# Patient Record
Sex: Male | Born: 1960 | Race: White | Hispanic: No | Marital: Single | Smoking: Never smoker
Health system: Southern US, Community
[De-identification: ages and names within clinical notes are randomized; demographics above are authoritative.]

## PROBLEM LIST (undated history)

## (undated) DIAGNOSIS — F419 Anxiety disorder, unspecified: Secondary | ICD-10-CM

## (undated) DIAGNOSIS — M25559 Pain in unspecified hip: Secondary | ICD-10-CM

## (undated) DIAGNOSIS — R945 Abnormal results of liver function studies: Secondary | ICD-10-CM

## (undated) DIAGNOSIS — R569 Unspecified convulsions: Secondary | ICD-10-CM

## (undated) DIAGNOSIS — T7840XA Allergy, unspecified, initial encounter: Secondary | ICD-10-CM

## (undated) DIAGNOSIS — F1910 Other psychoactive substance abuse, uncomplicated: Secondary | ICD-10-CM

## (undated) DIAGNOSIS — G8929 Other chronic pain: Secondary | ICD-10-CM

## (undated) HISTORY — DX: Abnormal results of liver function studies: R94.5

## (undated) HISTORY — PX: NO PAST SURGERIES: SHX2092

## (undated) HISTORY — DX: Anxiety disorder, unspecified: F41.9

## (undated) HISTORY — DX: Other psychoactive substance abuse, uncomplicated: F19.10

## (undated) HISTORY — DX: Unspecified convulsions: R56.9

## (undated) HISTORY — DX: Other chronic pain: G89.29

## (undated) HISTORY — DX: Pain in unspecified hip: M25.559

## (undated) HISTORY — DX: Allergy, unspecified, initial encounter: T78.40XA

---

## 2015-08-01 DIAGNOSIS — M25559 Pain in unspecified hip: Secondary | ICD-10-CM

## 2015-08-01 DIAGNOSIS — G8929 Other chronic pain: Secondary | ICD-10-CM

## 2015-08-01 HISTORY — DX: Other chronic pain: G89.29

## 2015-10-13 ENCOUNTER — Other Ambulatory Visit: Payer: Self-pay | Admitting: Family Medicine

## 2015-10-13 DIAGNOSIS — M25552 Pain in left hip: Secondary | ICD-10-CM

## 2015-10-14 ENCOUNTER — Ambulatory Visit
Admission: RE | Admit: 2015-10-14 | Discharge: 2015-10-14 | Disposition: A | Discharge status: Home / Self Care | Payer: Managed Care, Other (non HMO) | Source: Ambulatory Visit | Attending: Family Medicine | Admitting: Family Medicine

## 2015-10-14 DIAGNOSIS — M87852 Other osteonecrosis, left femur: Secondary | ICD-10-CM | POA: Insufficient documentation

## 2015-10-14 DIAGNOSIS — M25552 Pain in left hip: Secondary | ICD-10-CM | POA: Insufficient documentation

## 2015-10-14 DIAGNOSIS — S73192A Other sprain of left hip, initial encounter: Secondary | ICD-10-CM | POA: Diagnosis not present

## 2015-11-15 ENCOUNTER — Emergency Department
Admission: EM | Admit: 2015-11-15 | Discharge: 2015-11-15 | Disposition: A | Discharge status: Home / Self Care | Payer: Managed Care, Other (non HMO) | Attending: Emergency Medicine | Admitting: Emergency Medicine

## 2015-11-15 ENCOUNTER — Encounter: Payer: Self-pay | Admitting: Emergency Medicine

## 2015-11-15 ENCOUNTER — Emergency Department: Payer: Managed Care, Other (non HMO)

## 2015-11-15 DIAGNOSIS — K2100 Gastro-esophageal reflux disease with esophagitis, without bleeding: Secondary | ICD-10-CM

## 2015-11-15 DIAGNOSIS — R1110 Vomiting, unspecified: Secondary | ICD-10-CM | POA: Diagnosis present

## 2015-11-15 DIAGNOSIS — R51 Headache: Secondary | ICD-10-CM | POA: Diagnosis not present

## 2015-11-15 LAB — COMPREHENSIVE METABOLIC PANEL
ALT: 90 U/L — ABNORMAL HIGH (ref 17–63)
ANION GAP: 9 (ref 5–15)
AST: 271 U/L — AB (ref 15–41)
Albumin: 3.9 g/dL (ref 3.5–5.0)
Alkaline Phosphatase: 96 U/L (ref 38–126)
BUN: 13 mg/dL (ref 6–20)
CHLORIDE: 101 mmol/L (ref 101–111)
CO2: 25 mmol/L (ref 22–32)
Calcium: 9.3 mg/dL (ref 8.9–10.3)
Creatinine, Ser: 0.73 mg/dL (ref 0.61–1.24)
Glucose, Bld: 114 mg/dL — ABNORMAL HIGH (ref 65–99)
POTASSIUM: 3.7 mmol/L (ref 3.5–5.1)
Sodium: 135 mmol/L (ref 135–145)
TOTAL PROTEIN: 7.3 g/dL (ref 6.5–8.1)
Total Bilirubin: 0.8 mg/dL (ref 0.3–1.2)

## 2015-11-15 LAB — LIPASE, BLOOD: LIPASE: 45 U/L (ref 11–51)

## 2015-11-15 LAB — CBC WITH DIFFERENTIAL/PLATELET
BASOS ABS: 0.1 10*3/uL (ref 0–0.1)
Basophils Relative: 1 %
EOS PCT: 2 %
Eosinophils Absolute: 0.2 10*3/uL (ref 0–0.7)
HCT: 47.9 % (ref 40.0–52.0)
Hemoglobin: 16.5 g/dL (ref 13.0–18.0)
LYMPHS PCT: 43 %
Lymphs Abs: 4.2 10*3/uL — ABNORMAL HIGH (ref 1.0–3.6)
MCH: 33.3 pg (ref 26.0–34.0)
MCHC: 34.5 g/dL (ref 32.0–36.0)
MCV: 96.5 fL (ref 80.0–100.0)
MONO ABS: 0.7 10*3/uL (ref 0.2–1.0)
MONOS PCT: 8 %
Neutro Abs: 4.5 10*3/uL (ref 1.4–6.5)
Neutrophils Relative %: 46 %
PLATELETS: 258 10*3/uL (ref 150–440)
RBC: 4.96 MIL/uL (ref 4.40–5.90)
RDW: 14.5 % (ref 11.5–14.5)
WBC: 9.8 10*3/uL (ref 3.8–10.6)

## 2015-11-15 LAB — ETHANOL: ALCOHOL ETHYL (B): 94 mg/dL — AB (ref <5)

## 2015-11-15 MED ORDER — LORAZEPAM 1 MG PO TABS: 1.0000 mg | ORAL_TABLET | Freq: Two times a day (BID) | ORAL | Status: DC | PRN

## 2015-11-15 MED ORDER — ONDANSETRON HCL 4 MG/2ML IJ SOLN
4.0000 mg | Freq: Once | INTRAMUSCULAR | Status: AC
  Administered 2015-11-15: 4 mg via INTRAVENOUS
  Filled 2015-11-15: qty 2

## 2015-11-15 MED ORDER — GI COCKTAIL ~~LOC~~
30.0000 mL | Freq: Once | ORAL | Status: AC
  Administered 2015-11-15: 30 mL via ORAL
  Filled 2015-11-15: qty 30

## 2015-11-15 MED ORDER — SODIUM CHLORIDE 0.9 % IV BOLUS (SEPSIS)
500.0000 mL | Freq: Once | INTRAVENOUS | Status: AC
  Administered 2015-11-15: 500 mL via INTRAVENOUS

## 2015-11-15 MED ORDER — ONDANSETRON HCL 4 MG PO TABS: 4.0000 mg | ORAL_TABLET | Freq: Three times a day (TID) | ORAL | Status: DC | PRN

## 2015-11-15 MED ORDER — LORAZEPAM 2 MG/ML IJ SOLN
1.0000 mg | Freq: Once | INTRAMUSCULAR | Status: AC
  Administered 2015-11-15: 1 mg via INTRAVENOUS
  Filled 2015-11-15: qty 1

## 2015-11-15 MED ORDER — PANTOPRAZOLE SODIUM 20 MG PO TBEC: 20.0000 mg | DELAYED_RELEASE_TABLET | Freq: Every day | ORAL | Status: DC

## 2015-11-15 NOTE — Discharge Instructions (Signed)
Esophagitis °Esophagitis is inflammation of the esophagus. The esophagus is the tube that carries food and liquids from your mouth to your stomach. Esophagitis can cause soreness or pain in the esophagus. This condition can make it difficult and painful to swallow.  °CAUSES °Most causes of esophagitis are not serious. Common causes of this condition include: °· Gastroesophageal reflux disease (GERD). This is when stomach contents move back up into the esophagus (reflux). °· Repeated vomiting. °· An allergic-type reaction, especially caused by food allergies (eosinophilic esophagitis). °· Injury to the esophagus by swallowing large pills with or without water, or swallowing certain types of medicines. °· Swallowing (ingesting) harmful chemicals, such as household cleaning products. °· Heavy alcohol use. °· An infection of the esophagus. This most often occurs in people who have a weakened immune system. °· Radiation or chemotherapy treatment for cancer. °· Certain diseases such as sarcoidosis, Crohn disease, and scleroderma. °SYMPTOMS °Symptoms of this condition include: °· Difficult or painful swallowing. °· Pain with swallowing acidic liquids, such as citrus juices. °· Pain with burping. °· Chest pain. °· Difficulty breathing. °· Nausea. °· Vomiting. °· Pain in the abdomen. °· Weight loss. °· Ulcers in the mouth. °· Patches of white material in the mouth (candidiasis). °· Fever. °· Coughing up blood or vomiting blood. °· Stool that is black, tarry, or bright red. °DIAGNOSIS °Your health care provider will take a medical history and perform a physical exam. You may also have other tests, including: °· An endoscopy to examine your stomach and esophagus with a small camera. °· A test that measures the acidity level in your esophagus. °· A test that measures how much pressure is on your esophagus. °· A barium swallow or modified barium swallow to show the shape, size, and functioning of your esophagus. °· Allergy  tests. °TREATMENT °Treatment for this condition depends on the cause of your esophagitis. In some cases, steroids or other medicines may be given to help relieve your symptoms or to treat the underlying cause of your condition. You may have to make some lifestyle changes, such as: °· Avoiding alcohol. °· Quitting smoking. °· Changing your diet. °· Exercising. °· Changing your sleep habits and your sleep environment. °HOME CARE INSTRUCTIONS °Take these actions to decrease your discomfort and to help avoid complications. °Diet °· Follow a diet as recommended by your health care provider. This may involve avoiding foods and drinks such as: °¨ Coffee and tea (with or without caffeine). °¨ Drinks that contain alcohol. °¨ Energy drinks and sports drinks. °¨ Carbonated drinks or sodas. °¨ Chocolate and cocoa. °¨ Peppermint and mint flavorings. °¨ Garlic and onions. °¨ Horseradish. °¨ Spicy and acidic foods, including peppers, chili powder, curry powder, vinegar, hot sauces, and barbecue sauce. °¨ Citrus fruit juices and citrus fruits, such as oranges, lemons, and limes. °¨ Tomato-based foods, such as red sauce, chili, salsa, and pizza with red sauce. °¨ Fried and fatty foods, such as donuts, french fries, potato chips, and high-fat dressings. °¨ High-fat meats, such as hot dogs and fatty cuts of red and white meats, such as rib eye steak, sausage, ham, and bacon. °¨ High-fat dairy items, such as whole milk, butter, and cream cheese. °· Eat small, frequent meals instead of large meals. °· Avoid drinking large amounts of liquid with your meals. °· Avoid eating meals during the 2-3 hours before bedtime. °· Avoid lying down right after you eat. °· Do not exercise right after you eat. °· Avoid foods and drinks that seem to   make your symptoms worse. General Instructions  Pay attention to any changes in your symptoms.  Take over-the-counter and prescription medicines only as told by your health care provider. Do not take  aspirin, ibuprofen, or other NSAIDs unless your health care provider told you to do so.  If you have trouble taking pills, use a pill splitter to decrease the size of the pill. This will decrease the chance of the pill getting stuck or injuring your esophagus on the way down. Also, drink water after you take a pill.  Do not use any tobacco products, including cigarettes, chewing tobacco, and e-cigarettes. If you need help quitting, ask your health care provider.  Wear loose-fitting clothing. Do not wear anything tight around your waist that causes pressure on your abdomen.  Raise (elevate) the head of your bed about 6 inches (15 cm).  Try to reduce your stress, such as with yoga or meditation. If you need help reducing stress, ask your health care provider.  If you are overweight, reduce your weight to an amount that is healthy for you. Ask your health care provider for guidance about a safe weight loss goal.  Keep all follow-up visits as told by your health care provider. This is important. SEEK MEDICAL CARE IF:  You have new symptoms.  You have unexplained weight loss.  You have difficulty swallowing, or it hurts to swallow.  You have wheezing or a persistent cough.  Your symptoms do not improve with treatment.  You have frequent heartburn for more than two weeks. SEEK IMMEDIATE MEDICAL CARE IF:  You have severe pain in your arms, neck, jaw, teeth, or back.  You feel sweaty, dizzy, or light-headed.  You have chest pain or shortness of breath.  You vomit and your vomit looks like blood or coffee grounds.  Your stool is bloody or black.  You have a fever.  You cannot swallow, drink, or eat.   This information is not intended to replace advice given to you by your health care provider. Make sure you discuss any questions you have with your health care provider.   Document Released: 08/24/2004 Document Revised: 04/07/2015 Document Reviewed: 11/11/2014 [employer]  Patient Education [employer]2016 [employer]  Please return immediately if condition worsens. Please contact her primary physician or the physician you were given for referral. If you have any specialist physicians involved in her treatment and plan please also contact them. Thank you for using [facility]. He most likely will require gastroenterology consultation. Please work on your alcohol addiction.

## 2015-11-15 NOTE — ED Notes (Addendum)
Pt presents to ED with headache and frequent vomiting for the past 4 hours with chest pain and left arm numbness for "a while but worse the past 2 weeks". Pt states since he got his dentures about 5 years ago he has been having a lot of nausea and vomiting after eating unless he drinks a lot of water. Pt reports tonight he had pork and started vomiting right after eating it. Tried Rolaids with no relief. Drinks beer daily (8-12). denies difficulty breathing. Pt appears slightly anxious. No increased work of breathing or acute distress noted. Skin warm and dry.

## 2015-11-15 NOTE — ED Provider Notes (Signed)
Time Seen: Approximately 2140 I have reviewed the triage notes  Chief Complaint: Chest Pain; Emesis; Numbness; and Headache   History of Present Illness: Chad Parrish is a 55 y.o. male who states he's had probably a several year history of intermittent chest discomfort with difficulty swallowing. Patient has a long history of heavy alcohol consumption. He states over the last couple weeks he's noticed an increase in the frequency and intensity of his symptoms worries having difficulty swallowing and has had some frequent vomiting and feels like the food is getting stuck. Patient stridor or over-the-counter Rolaids without relief. He states he's had approximately 5 beers today starting at 7 AM. Last alcohol consumption was probably around 4:30 this afternoon. He states that sometime when he felt like he had a piece of pork that got stuck from a [place] dinner. He is able get his own secretions down and denies any persistent vomiting other than when he is trying to eat. He denies any melena or hematochezia. Denies any focal abdominal pain. He states he chronically has loose stool.   History reviewed. No pertinent past medical history.  There are no active problems to display for this patient.   History reviewed. No pertinent past surgical history.  History reviewed. No pertinent past surgical history.  No current outpatient prescriptions on file.  Allergies:  Opium  Family History: No family history on file.  Social History: Social History  Substance Use Topics  . Smoking status: Never Smoker   . Smokeless tobacco: None  . Alcohol Use: 6.0 oz/week    10 Cans of beer per week     Review of Systems:   10 point review of systems was performed and was otherwise negative:  Constitutional: No fever Eyes: No visual disturbances ENT: No sore throat, ear pain Cardiac: Chest pain is primarily in the epigastric area to the lower chest region Respiratory: No shortness of breath,  wheezing, or stridor Abdomen: No lower abdominal pain ,  Endocrine: No weight loss, No night sweats Extremities: No peripheral edema, cyanosis Skin: No rashes, easy bruising Neurologic: No focal weakness, trouble with speech or swollowing Urologic: No dysuria, Hematuria, or urinary frequency   Physical Exam:  ED Triage Vitals  Enc Vitals Group     BP 11/15/15 2106 143/97 mmHg     Pulse Rate 11/15/15 2106 98     Resp 11/15/15 2058 20     Temp 11/15/15 2106 98.2 F (36.8 C)     Temp Source 11/15/15 2058 Oral     SpO2 11/15/15 2106 98 %     Weight 11/15/15 2058 170 lb (77.111 kg)     Height 11/15/15 2058 5\' 6"  (1.676 m)     Head Cir --      Peak Flow --      Pain Score 11/15/15 2058 5     Pain Loc --      Pain Edu? --      Excl. in GC? --     General: Awake , Alert , and Oriented times 3; GCS 15 Anxious Head: Normal cephalic , atraumatic Eyes: Pupils equal , round, reactive to light Nose/Throat: No nasal drainage, patent upper airway without erythema or exudate.  Neck: Supple, Full range of motion, No anterior adenopathy or palpable thyroid masses Lungs: Clear to ascultation without wheezes , rhonchi, or rales Heart: Regular rate, regular rhythm without murmurs , gallops , or rubs Abdomen: Pain is primarily in the epigastric area Soft, non tender without rebound, guarding ,  or rigidity; bowel sounds positive and symmetric in all 4 quadrants. No organomegaly .   Negative Murphy's sign and no palpable masses or lower peritoneal signs Extremities: 2 plus symmetric pulses. No edema, clubbing or cyanosis Neurologic: normal ambulation, Motor symmetric without deficits, sensory intact Skin: warm, dry, no rashes   Labs:   All laboratory work was reviewed including any pertinent negatives or positives listed below:  Labs Reviewed  CBC WITH DIFFERENTIAL/PLATELET  COMPREHENSIVE METABOLIC PANEL  LIPASE, BLOOD  ETHANOL    EKG: ED ECG REPORT I, Jennye Moccasin, the attending  physician, personally viewed and interpreted this ECG.  Date: 11/15/2015 EKG Time: 2053 Rate: 96 Rhythm: normal sinus rhythm QRS Axis: normal Intervals: normal ST/T Wave abnormalities: normal Conduction Disturbances: none Narrative Interpretation: unremarkable No acute ischemic changes   Radiology:   Final result by Rad Results In Interface (11/15/15 22:45:34)   Narrative:   CLINICAL DATA: Chest pain today. Left arm numbness. Vomiting.  EXAM: CHEST 2 VIEW  COMPARISON: None.  FINDINGS: The cardiomediastinal contours are normal. The lungs are clear. Pulmonary vasculature is normal. No consolidation, pleural effusion, or pneumothorax. No acute osseous abnormalities are seen.  IMPRESSION: No acute pulmonary process.   Electronically Signed By: Rubye Oaks M.D. On: 11/15/2015 22:45          I personally reviewed the radiologic studies    ED Course: Patient's stay here was uneventful and patient was symptomatically improved. I felt his chest pain was unlikely to be from a life-threatening source at this point though he has a long deep history of alcohol abuse. He most likely has some reflux with esophageal spasm, etc. Patient be started on proton pump inhibitor therapy and was given some IV fluids and Ativan here along with Zofran with symptomatic improvement. Does not appear to have the upper or lower gastrointestinal bleed. The patient will be discharged with appropriate medication and was advised to follow up with her primary physician and also seek  patient treatment for alcohol abuse. His results of his studies an outpatient recommendations were given at home with his partner listening and responding to questions etc.   Assessment: Alcohol abuse Gastroesophageal disease      Plan:  Outpatient management Patient was advised to return immediately if condition worsens. Patient was advised to follow up with their primary care physician or other  specialized physicians involved in their outpatient care. The patient and/or family member/power of attorney had laboratory results reviewed at the bedside. All questions and concerns were addressed and appropriate discharge instructions were distributed by the nursing staff.             Jennye Moccasin, MD 11/15/15 [phone]

## 2015-12-31 DIAGNOSIS — F1020 Alcohol dependence, uncomplicated: Secondary | ICD-10-CM | POA: Insufficient documentation

## 2015-12-31 DIAGNOSIS — R748 Abnormal levels of other serum enzymes: Secondary | ICD-10-CM | POA: Insufficient documentation

## 2015-12-31 DIAGNOSIS — K2920 Alcoholic gastritis without bleeding: Secondary | ICD-10-CM | POA: Insufficient documentation

## 2015-12-31 DIAGNOSIS — M87052 Idiopathic aseptic necrosis of left femur: Secondary | ICD-10-CM | POA: Insufficient documentation

## 2017-01-28 DIAGNOSIS — R945 Abnormal results of liver function studies: Secondary | ICD-10-CM

## 2017-01-28 HISTORY — DX: Abnormal results of liver function studies: R94.5

## 2017-10-03 ENCOUNTER — Other Ambulatory Visit: Payer: Self-pay

## 2017-10-03 ENCOUNTER — Encounter: Payer: Self-pay | Admitting: Psychiatry

## 2017-10-03 ENCOUNTER — Ambulatory Visit (INDEPENDENT_AMBULATORY_CARE_PROVIDER_SITE_OTHER): Payer: 59 | Admitting: Psychiatry

## 2017-10-03 VITALS — BP 162/90 | HR 73 | Temp 98.1°F

## 2017-10-03 DIAGNOSIS — F1924 Other psychoactive substance dependence with psychoactive substance-induced mood disorder: Secondary | ICD-10-CM

## 2017-10-03 DIAGNOSIS — F1210 Cannabis abuse, uncomplicated: Secondary | ICD-10-CM

## 2017-10-03 DIAGNOSIS — F1020 Alcohol dependence, uncomplicated: Secondary | ICD-10-CM

## 2017-10-03 DIAGNOSIS — F19239 Other psychoactive substance dependence with withdrawal, unspecified: Secondary | ICD-10-CM

## 2017-10-03 MED ORDER — MIRTAZAPINE 15 MG PO TABS: 7.5000 mg | ORAL_TABLET | Freq: Every day | ORAL | 1 refills | Status: DC

## 2017-10-03 MED ORDER — GABAPENTIN 100 MG PO CAPS
100.0000 mg | ORAL_CAPSULE | Freq: Two times a day (BID) | ORAL | 1 refills | Status: DC
Start: 2017-10-03 — End: 2018-09-14

## 2017-10-03 NOTE — Patient Instructions (Addendum)
Mirtazapine tablets What is this medicine? MIRTAZAPINE (mir TAZ a peen) is used to treat depression. This medicine may be used for other purposes; ask your health care provider or pharmacist if you have questions. COMMON BRAND NAME(S): Remeron What should I tell my health care provider before I take this medicine? They need to know if you have any of these conditions: -bipolar disorder -glaucoma -kidney disease -liver disease -suicidal thoughts -an unusual or allergic reaction to mirtazapine, other medicines, foods, dyes, or preservatives -pregnant or trying to get pregnant -breast-feeding How should I use this medicine? Take this medicine by mouth with a glass of water. Follow the directions on the prescription label. Take your medicine at regular intervals. Do not take your medicine more often than directed. Do not stop taking this medicine suddenly except upon the advice of your doctor. Stopping this medicine too quickly may cause serious side effects or your condition may worsen. A special MedGuide will be given to you by the pharmacist with each prescription and refill. Be sure to read this information carefully each time. Talk to your pediatrician regarding the use of this medicine in children. Special care may be needed. Overdosage: If you think you have taken too much of this medicine contact a poison control center or emergency room at once. NOTE: This medicine is only for you. Do not share this medicine with others. What if I miss a dose? If you miss a dose, take it as soon as you can. If it is almost time for your next dose, take only that dose. Do not take double or extra doses. What may interact with this medicine? Do not take this medicine with any of the following medications: -linezolid -MAOIs like Carbex, Eldepryl, Marplan, Nardil, and Parnate -methylene blue (injected into a vein) This medicine may also interact with the following medications: -alcohol -antiviral  medicines for HIV or AIDS -certain medicines that treat or prevent blood clots like warfarin -certain medicines for depression, anxiety, or psychotic disturbances -certain medicines for fungal infections like ketoconazole and itraconazole -certain medicines for migraine headache like almotriptan, eletriptan, frovatriptan, naratriptan, rizatriptan, sumatriptan, zolmitriptan -certain medicines for seizures like carbamazepine or phenytoin -certain medicines for sleep -cimetidine -erythromycin -fentanyl -lithium -medicines for blood pressure -nefazodone -rasagiline -rifampin -supplements like St. John's wort, kava kava, valerian -tramadol -tryptophan This list may not describe all possible interactions. Give your health care provider a list of all the medicines, herbs, non-prescription drugs, or dietary supplements you use. Also tell them if you smoke, drink alcohol, or use illegal drugs. Some items may interact with your medicine. What should I watch for while using this medicine? Tell your doctor if your symptoms do not get better or if they get worse. Visit your doctor or health care professional for regular checks on your progress. Because it may take several weeks to see the full effects of this medicine, it is important to continue your treatment as prescribed by your doctor. Patients and their families should watch out for new or worsening thoughts of suicide or depression. Also watch out for sudden changes in feelings such as feeling anxious, agitated, panicky, irritable, hostile, aggressive, impulsive, severely restless, overly excited and hyperactive, or not being able to sleep. If this happens, especially at the beginning of treatment or after a change in dose, call your health care professional. You may get drowsy or dizzy. Do not drive, use machinery, or do anything that needs mental alertness until you know how this medicine affects you. Do not   stand or sit up quickly, especially if  you are an older patient. This reduces the risk of dizzy or fainting spells. Alcohol may interfere with the effect of this medicine. Avoid alcoholic drinks. This medicine may cause dry eyes and blurred vision. If you wear contact lenses you may feel some discomfort. Lubricating drops may help. See your eye doctor if the problem does not go away or is severe. Your mouth may get dry. Chewing sugarless gum or sucking hard candy, and drinking plenty of water may help. Contact your doctor if the problem does not go away or is severe. What side effects may I notice from receiving this medicine? Side effects that you should report to your doctor or health care professional as soon as possible: -allergic reactions like skin rash, itching or hives, swelling of the face, lips, or tongue -anxious -changes in vision -chest pain -confusion -elevated mood, decreased need for sleep, racing thoughts, impulsive behavior -eye pain -fast, irregular heartbeat -feeling faint or lightheaded, falls -feeling agitated, angry, or irritable -fever or chills, sore throat -hallucination, loss of contact with reality -loss of balance or coordination -mouth sores -redness, blistering, peeling or loosening of the skin, including inside the mouth -restlessness, pacing, inability to keep still -seizures -stiff muscles -suicidal thoughts or other mood changes -trouble passing urine or change in the amount of urine -trouble sleeping -unusual bleeding or bruising -unusually weak or tired -vomiting Side effects that usually do not require medical attention (report to your doctor or health care professional if they continue or are bothersome): -change in appetite -constipation -dizziness -dry mouth -muscle aches or pains -nausea -tired -weight gain This list may not describe all possible side effects. Call your doctor for medical advice about side effects. You may report side effects to FDA at 1-800-FDA-1088. Where  should I keep my medicine? Keep out of the reach of children. Store at room temperature between 15 and 30 degrees C (59 and 86 degrees F) Protect from light and moisture. Throw away any unused medicine after the expiration date. NOTE: This sheet is a summary. It may not cover all possible information. If you have questions about this medicine, talk to your doctor, pharmacist, or health care provider.  2018 Elsevier/Gold Standard (2015-12-16 17:30:45) Gabapentin capsules or tablets What is this medicine? GABAPENTIN (GA ba pen tin) is used to control partial seizures in adults with epilepsy. It is also used to treat certain types of nerve pain. This medicine may be used for other purposes; ask your health care provider or pharmacist if you have questions. COMMON BRAND NAME(S): Active-PAC with Gabapentin, Gabarone, Neurontin What should I tell my health care provider before I take this medicine? They need to know if you have any of these conditions: -kidney disease -suicidal thoughts, plans, or attempt; a previous suicide attempt by you or a family member -an unusual or allergic reaction to gabapentin, other medicines, foods, dyes, or preservatives -pregnant or trying to get pregnant -breast-feeding How should I use this medicine? Take this medicine by mouth with a glass of water. Follow the directions on the prescription label. You can take it with or without food. If it upsets your stomach, take it with food.Take your medicine at regular intervals. Do not take it more often than directed. Do not stop taking except on your doctor's advice. If you are directed to break the 600 or 800 mg tablets in half as part of your dose, the extra half tablet should be used for the next dose.  If you have not used the extra half tablet within 28 days, it should be thrown away. A special MedGuide will be given to you by the pharmacist with each prescription and refill. Be sure to read this information carefully  each time. Talk to your pediatrician regarding the use of this medicine in children. Special care may be needed. Overdosage: If you think you have taken too much of this medicine contact a poison control center or emergency room at once. NOTE: This medicine is only for you. Do not share this medicine with others. What if I miss a dose? If you miss a dose, take it as soon as you can. If it is almost time for your next dose, take only that dose. Do not take double or extra doses. What may interact with this medicine? Do not take this medicine with any of the following medications: -other gabapentin products This medicine may also interact with the following medications: -alcohol -antacids -antihistamines for allergy, cough and cold -certain medicines for anxiety or sleep -certain medicines for depression or psychotic disturbances -homatropine; hydrocodone -naproxen -narcotic medicines (opiates) for pain -phenothiazines like chlorpromazine, mesoridazine, prochlorperazine, thioridazine This list may not describe all possible interactions. Give your health care provider a list of all the medicines, herbs, non-prescription drugs, or dietary supplements you use. Also tell them if you smoke, drink alcohol, or use illegal drugs. Some items may interact with your medicine. What should I watch for while using this medicine? Visit your doctor or health care professional for regular checks on your progress. You may want to keep a record at home of how you feel your condition is responding to treatment. You may want to share this information with your doctor or health care professional at each visit. You should contact your doctor or health care professional if your seizures get worse or if you have any new types of seizures. Do not stop taking this medicine or any of your seizure medicines unless instructed by your doctor or health care professional. Stopping your medicine suddenly can increase your seizures  or their severity. Wear a medical identification bracelet or chain if you are taking this medicine for seizures, and carry a card that lists all your medications. You may get drowsy, dizzy, or have blurred vision. Do not drive, use machinery, or do anything that needs mental alertness until you know how this medicine affects you. To reduce dizzy or fainting spells, do not sit or stand up quickly, especially if you are an older patient. Alcohol can increase drowsiness and dizziness. Avoid alcoholic drinks. Your mouth may get dry. Chewing sugarless gum or sucking hard candy, and drinking plenty of water will help. The use of this medicine may increase the chance of suicidal thoughts or actions. Pay special attention to how you are responding while on this medicine. Any worsening of mood, or thoughts of suicide or dying should be reported to your health care professional right away. Women who become pregnant while using this medicine may enroll in the [place] Antiepileptic Drug Pregnancy Registry by calling [phone]. This registry collects information about the safety of antiepileptic drug use during pregnancy. What side effects may I notice from receiving this medicine? Side effects that you should report to your doctor or health care professional as soon as possible: -allergic reactions like skin rash, itching or hives, swelling of the face, lips, or tongue -worsening of mood, thoughts or actions of suicide or dying Side effects that usually do not require medical attention (  report to your doctor or health care professional if they continue or are bothersome): -constipation -difficulty walking or controlling muscle movements -dizziness -nausea -slurred speech -tiredness -tremors -weight gain This list may not describe all possible side effects. Call your doctor for medical advice about side effects. You may report side effects to FDA at 1-800-FDA-1088. Where should I keep my  medicine? Keep out of reach of children. This medicine may cause accidental overdose and death if it taken by other adults, children, or pets. Mix any unused medicine with a substance like cat litter or coffee grounds. Then throw the medicine away in a sealed container like a sealed bag or a coffee can with a lid. Do not use the medicine after the expiration date. Store at room temperature between 15 and 30 degrees C (59 and 86 degrees F). NOTE: This sheet is a summary. It may not cover all possible information. If you have questions about this medicine, talk to your doctor, pharmacist, or health care provider.  2018 Elsevier/Gold Standard (2013-09-12 15:26:50) Alcohol Use Disorder Alcohol use disorder is when your drinking disrupts your daily life. When you have this condition, you drink too much alcohol and you cannot control your drinking. Alcohol use disorder can cause serious problems with your physical health. It can affect your brain, heart, liver, pancreas, immune system, stomach, and intestines. Alcohol use disorder can increase your risk for certain cancers and cause problems with your mental health, such as depression, anxiety, psychosis, delirium, and dementia. People with this disorder risk hurting themselves and others. What are the causes? This condition is caused by drinking too much alcohol over time. It is not caused by drinking too much alcohol only one or two times. Some people with this condition drink alcohol to cope with or escape from negative life events. Others drink to relieve pain or symptoms of mental illness. What increases the risk? You are more likely to develop this condition if:  You have a family history of alcohol use disorder.  Your culture encourages drinking to the point of intoxication, or makes alcohol easy to get.  You had a mood or conduct disorder in childhood.  You have been a victim of abuse.  You are an adolescent and: ? You have poor grades or  difficulties in school. ? Your caregivers do not talk to you about saying no to alcohol, or supervise your activities. ? You are impulsive or you have trouble with self-control.  What are the signs or symptoms? Symptoms of this condition include:  Drinkingmore than you want to.  Drinking for longer than you want to.  Trying several times to drink less or to control your drinking.  Spending a lot of time getting alcohol, drinking, or recovering from drinking.  Craving alcohol.  Having problems at work, at school, or at home due to drinking.  Having problems in relationships due to drinking.  Drinking when it is dangerous to drink, such as before driving a car.  Continuing to drink even though you know you might have a physical or mental problem related to drinking.  Needing more and more alcohol to get the same effect you want from the alcohol (building up tolerance).  Having symptoms of withdrawal when you stop drinking. Symptoms of withdrawal include: ? Fatigue. ? Nightmares. ? Trouble sleeping. ? Depression. ? Anxiety. ? Fever. ? Seizures. ? Severe confusion. ? Feeling or seeing things that are not there (hallucinations). ? Tremors. ? Rapid heart rate. ? Rapid breathing. ? High  blood pressure.  Drinking to avoid symptoms of withdrawal.  How is this diagnosed? This condition is diagnosed with an assessment. Your health care provider may start the assessment by asking three or four questions about your drinking. Your health care provider may perform a physical exam or do lab tests to see if you have physical problems resulting from alcohol use. She or he may refer you to a mental health professional for evaluation. How is this treated? Some people with alcohol use disorder are able to reduce their alcohol use to low-risk levels. Others need to completely quit drinking alcohol. When necessary, mental health professionals with specialized training in substance use  treatment can help. Your health care provider can help you decide how severe your alcohol use disorder is and what type of treatment you need. The following forms of treatment are available:  Detoxification. Detoxification involves quitting drinking and using prescription medicines within the first week to help lessen withdrawal symptoms. This treatment is important for people who have had withdrawal symptoms before and for heavy drinkers who are likely to have withdrawal symptoms. Alcohol withdrawal can be dangerous, and in severe cases, it can cause death. Detoxification may be provided in a home, community, or primary care setting, or in a hospital or substance use treatment facility.  Counseling. This treatment is also called talk therapy. It is provided by substance use treatment counselors. A counselor can address the reasons you use alcohol and suggest ways to keep you from drinking again or to prevent problem drinking. The goals of talk therapy are to: ? Find healthy activities and ways for you to cope with stress. ? Identify and avoid the things that trigger your alcohol use. ? Help you learn how to handle cravings.  Medicines.Medicines can help treat alcohol use disorder by: ? Decreasing alcohol cravings. ? Decreasing the positive feeling you have when you drink alcohol. ? Causing an uncomfortable physical reaction when you drink alcohol (aversion therapy).  Support groups. Support groups are led by people who have quit drinking. They provide emotional support, advice, and guidance.  These forms of treatment are often combined. Some people with this condition benefit from a combination of treatments provided by specialized substance use treatment centers. Follow these instructions at home:  Take over-the-counter and prescription medicines only as told by your health care provider.  Check with your health care provider before starting any new medicines.  Ask friends and family  members not to offer you alcohol.  Avoid situations where alcohol is served, including gatherings where others are drinking alcohol.  Create a plan for what to do when you are tempted to use alcohol.  Find hobbies or activities that you enjoy that do not include alcohol.  Keep all follow-up visits as told by your health care provider. This is important. How is this prevented?  If you drink, limit alcohol intake to no more than 1 drink a day for nonpregnant women and 2 drinks a day for men. One drink equals 12 oz of beer, 5 oz of wine, or 1 oz of hard liquor.  If you have a mental health condition, get treatment and support.  Do not give alcohol to adolescents.  If you are an adolescent: ? Do not drink alcohol. ? Do not be afraid to say no if someone offers you alcohol. Speak up about why you do not want to drink. You can be a positive role model for your friends and set a good example for those around you  by not drinking alcohol. ? If your friends drink, spend time with others who do not drink alcohol. Make new friends who do not use alcohol. ? Find healthy ways to manage stress and emotions, such as meditation or deep breathing, exercise, spending time in nature, listening to music, or talking with a trusted friend or family member. Contact a health care provider if:  You are not able to take your medicines as told.  Your symptoms get worse.  You return to drinking alcohol (relapse) and your symptoms get worse. Get help right away if:  You have thoughts about hurting yourself or others. If you ever feel like you may hurt yourself or others, or have thoughts about taking your own life, get help right away. You can go to your nearest emergency department or call:  Your local emergency services (911 in the [place]  A suicide crisis helpline, such as the National Suicide Prevention Lifeline at (907)335-9434. This is open 24 hours a day.  Summary  Alcohol use disorder is when  your drinking disrupts your daily life. When you have this condition, you drink too much alcohol and you cannot control your drinking.  Treatment may include detoxification, counseling, medicine, and support groups.  Ask friends and family members not to offer you alcohol. Avoid situations where alcohol is served.  Get help right away if you have thoughts about hurting yourself or others. This information is not intended to replace advice given to you by your health care provider. Make sure you discuss any questions you have with your health care provider. Document Released: 08/24/2004 Document Revised: 04/13/2016 Document Reviewed: 04/13/2016 Elsevier Interactive Patient Education  [employer].

## 2017-10-03 NOTE — Progress Notes (Signed)
Psychiatric Initial Adult Assessment   Patient Identification: Chad Parrish MRN:  [id number] Date of Evaluation:  10/03/2017 Referral Source: Leotis Shames MD Chief Complaint:  ' I have anger issues.' Chief Complaint    Establish Care; Agitation; Drug / Alcohol Assessment     Visit Diagnosis:    ICD-10-CM   1. 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 Alcohol use disorder, severe, dependence (HCC) F10.20   3. Cannabis use disorder, mild, abuse F12.10     History of Present Illness: Chad Parrish is a 57 year old Caucasian male, single, employed, lives in [place], has a history of alcohol use disorder, mood lability, chronic pain, abnormal liver function, presented to the clinic today to establish care.  Patient today reports that he struggles with irritability and anger issues.  He reports this has been ongoing since the past 6 years or so.  Patient reports it is getting worse and he felt like he needed to get help.  Patient reports he does not like people.  Patient reports that he has been losing his temper for simple reasons like losing his usual parking space and so on.  Pt reports he has never gotten violent or aggressive.  Patient denies any legal issues except for  some legal problems for drug issues 28 years ago when he was placed in jail for 2 months.  Patient reports appetite problems.  Patient reports he never eats.  Patient reports he has struggled with appetite  since he were a child.  He reports his mom had taken him to a pediatrician when he was younger however he did not recommend any medications at that time.  Patient reports he eats may be a small snack or a meal per day and that is all he takes.  PMD has prescribed him folic acid as well as thiamine but he is not compliant with it.  Patient reports he has sleep issues  when he does not drink.  Reports his doctor had prescribed him melatonin but he never took it.  Patient struggles with alcoholism.  He has been drinking since the age of 57 years old.  Patient reports he drinks 10-12  beer per night.  Patient reports he goes to work ,comes back home ,drinks beer and goes to bed.  Patient reports he is able to wake up every day at 6 AM in spite of drinking too much.  Patient reports he has no problem going back to work every day even though he drinks a lot.  Patient does report some withdrawal symptoms like bilateral hand tremors.  Patient however denies any serious withdrawal symptoms from his drinking.  Patient also uses cannabis on weekends.  Patient reports that he has been doing so since the past several years.  He reports $40 worth of cannabis lasting for a month.  Patient denies any history of depression, anxiety, perceptual disturbances, suicidality or homicidality.  Patient denies any history of trauma.  Patient lives with his partner of 41 years.  Pt is employed and reports his work as going well.  Associated Signs/Symptoms: Depression Symptoms:  disturbed sleep, decreased appetite, mood lability (Hypo) Manic Symptoms:  Irritable Mood, Anxiety Symptoms:  denies Psychotic Symptoms:  denies PTSD Symptoms: Negative  Past Psychiatric History: Patient denies ever being to any inpatient mental health hospitals before.  Patient denies any suicide attempts.  Patient denies any previous  psychiatric treatment.  Previous Psychotropic Medications: Yes melatonin  Substance Abuse History in the last 12 months:  Yes.  Alcohol since the age of 57. He drinks 12 beers per day .Cannabis since the past several years - 40 $ worth will last for a month. He smokes every weekend.  Consequences of Substance Abuse: Medical Consequences:  AST elevated Withdrawal Symptoms:   Tremors  Past Medical History:  Past Medical History:  Diagnosis Date  . Abnormal liver  function 01/2017  . Chronic hip pain 2017   History reviewed. No pertinent surgical history.  Family Psychiatric History: Pt reports his maternal aunt has mental illness.  Family History:  Family History  Problem Relation Age of Onset  . Mental illness Maternal Aunt     Social History:   Social History   Socioeconomic History  . Marital status: Single    Spouse name: None  . Number of children: None  . Years of education: None  . Highest education level: None  Social Needs  . Financial resource strain: None  . Food insecurity - worry: None  . Food insecurity - inability: None  . Transportation needs - medical: No  . Transportation needs - non-medical: No  Occupational History  . None  Tobacco Use  . Smoking status: Never Smoker  . Smokeless tobacco: Never Used  Substance and Sexual Activity  . Alcohol use: Yes    Alcohol/week: 42.0 oz    Types: 70 Cans of beer per week    Comment: 10 beers a day / or more. everyday  . Drug use: Yes    Types: Marijuana  . Sexual activity: Not Currently  Other Topics Concern  . None  Social History Narrative  . None    Additional Social History: Patient is employed.  He works at a [employer]  He lives with his partner of 41 years.  He denies having children.  He reports he was raised mainly by his mother.  She passed away due to cancer a few years ago.  He does not have any siblings.  Allergies:   Allergies  Allergen Reactions  . Opium Itching    Metabolic Disorder Labs: No results found for: HGBA1C, MPG No results found for: PROLACTIN No results found for: CHOL, TRIG, HDL, CHOLHDL, VLDL, LDLCALC   Current Medications: Current Outpatient Medications  Medication Sig Dispense Refill  . meloxicam (MOBIC) 15 MG tablet     . traMADol (ULTRAM) 50 MG tablet     . gabapentin (NEURONTIN) 100 MG capsule Take 1 capsule (100 mg total) by mouth 2 (two) times daily. 60 capsule 1  . mirtazapine (REMERON) 15 MG tablet Take 0.5  tablets (7.5 mg total) by mouth at bedtime. 15 tablet 1   No current facility-administered medications for this visit.     Neurologic: Headache: No Seizure: No Paresthesias:No  Musculoskeletal: Strength & Muscle Tone: within normal limits Gait & Station: normal Patient leans: N/A  Psychiatric Specialty Exam: Review of Systems  Neurological: Positive for tremors.  Psychiatric/Behavioral: Positive for substance abuse. The patient has insomnia.   All other systems reviewed and are negative.   Blood pressure (!) 162/90, pulse 73, temperature 98.1 F (36.7 C), temperature source Oral.There is no height or weight on file to calculate BMI.  General Appearance: Casual  Eye Contact:  Fair  Speech:  Normal Rate  Volume:  Normal  Mood:  Anxious  Affect:  Congruent  Thought Process:  Goal Directed and Descriptions of Associations: Intact  Orientation:  Full (Time, Place, and Person)  Thought Content:  Logical  Suicidal Thoughts:  No  Homicidal Thoughts:  No  Memory:  Immediate;   Fair Recent;   Fair Remote;   Fair  Judgement:  Fair  Insight:  Fair  Psychomotor Activity:  Tremor  Concentration:  Concentration: Fair and Attention Span: Fair  Recall:  FiservFair  Fund of Knowledge:Fair  Language: Fair  Akathisia:  No  Handed:  Right  AIMS (if indicated):  NA  Assets:  Desire for Improvement Social Support Transportation  ADL's:  Intact  Cognition: WNL  Sleep:  poor    Treatment Plan Summary:Donzell is a 57 year old Caucasian male who is employed, lives with his partner in [place], has a history of alcoholism, cannabis use disorder, chronic pain, presented to the clinic today to establish care.  Loraine LericheMark struggles with some irritability and anger issues as well as sleep problems likely due to his coexisting substance abuse problems.  Loraine LericheMark also struggles with appetite problems but states he has lived with it ever since he were a child.  Loraine LericheMark is employed and has social support system.  He does  have a history of mental health problems in his family.  Loraine LericheMark is motivated to start psychotherapy as well as medications.  Loraine LericheMark reports he does not do well in group therapy and would like individual therapy at this time.  Plan as noted below. Medication management and Plan as noted below   For substance induced bipolar and related disorders Start gabapentin 100 mg p.o. twice daily Start Remeron 7.5 mg p.o. Nightly. (Poor sleep, appetite changes) Refer for psychotherapy with Ms. Nolon RodNicole Peacock.  For alcohol use disorder Provided substance abuse counseling. Start gabapentin 100 mg p.o. twice daily Refer for psychotherapy.  For cannabis use disorder Provided substance abuse counseling. Refer for psychotherapy.  I have reviewed labs in EHR done by his PMD.TSH - wnl. AST - elevated (02/27/2017) .  PMD is currently managing the same.  Follow-up in clinic in 1 month or sooner if needed.  More than 50 % of the time was spent for psychoeducation and supportive psychotherapy and care coordination.  This note was generated in part or whole with voice recognition software. Voice recognition is usually quite accurate but there are transcription errors that can and very often do occur. I apologize for any typographical errors that were not detected and corrected.       Jomarie LongsSaramma Joan Avetisyan, MD 3/6/20192:06 PM

## 2017-10-29 ENCOUNTER — Ambulatory Visit: Payer: 59 | Admitting: Licensed Clinical Social Worker

## 2017-11-02 ENCOUNTER — Ambulatory Visit: Payer: 59 | Admitting: Psychiatry

## 2018-09-09 ENCOUNTER — Other Ambulatory Visit: Payer: Self-pay

## 2018-09-09 ENCOUNTER — Inpatient Hospital Stay
Admission: EM | Admit: 2018-09-09 | Discharge: 2018-09-14 | DRG: 897 | Disposition: A | Discharge status: Home / Self Care | Payer: Medicaid Other | Attending: Internal Medicine | Admitting: Internal Medicine

## 2018-09-09 ENCOUNTER — Emergency Department: Payer: Medicaid Other

## 2018-09-09 DIAGNOSIS — R569 Unspecified convulsions: Secondary | ICD-10-CM | POA: Diagnosis present

## 2018-09-09 DIAGNOSIS — W06XXXA Fall from bed, initial encounter: Secondary | ICD-10-CM | POA: Diagnosis present

## 2018-09-09 DIAGNOSIS — E876 Hypokalemia: Secondary | ICD-10-CM | POA: Diagnosis present

## 2018-09-09 DIAGNOSIS — S0101XA Laceration without foreign body of scalp, initial encounter: Secondary | ICD-10-CM | POA: Diagnosis present

## 2018-09-09 DIAGNOSIS — Z801 Family history of malignant neoplasm of trachea, bronchus and lung: Secondary | ICD-10-CM

## 2018-09-09 DIAGNOSIS — Z885 Allergy status to narcotic agent status: Secondary | ICD-10-CM

## 2018-09-09 DIAGNOSIS — R339 Retention of urine, unspecified: Secondary | ICD-10-CM | POA: Diagnosis not present

## 2018-09-09 DIAGNOSIS — F10239 Alcohol dependence with withdrawal, unspecified: Secondary | ICD-10-CM

## 2018-09-09 DIAGNOSIS — F10939 Alcohol use, unspecified with withdrawal, unspecified: Secondary | ICD-10-CM

## 2018-09-09 DIAGNOSIS — F1290 Cannabis use, unspecified, uncomplicated: Secondary | ICD-10-CM | POA: Diagnosis present

## 2018-09-09 DIAGNOSIS — Y92013 Bedroom of single-family (private) house as the place of occurrence of the external cause: Secondary | ICD-10-CM

## 2018-09-09 DIAGNOSIS — F10231 Alcohol dependence with withdrawal delirium: Principal | ICD-10-CM | POA: Diagnosis present

## 2018-09-09 DIAGNOSIS — D6959 Other secondary thrombocytopenia: Secondary | ICD-10-CM | POA: Diagnosis present

## 2018-09-09 DIAGNOSIS — Z791 Long term (current) use of non-steroidal anti-inflammatories (NSAID): Secondary | ICD-10-CM

## 2018-09-09 LAB — CBC WITH DIFFERENTIAL/PLATELET
Abs Immature Granulocytes: 0.05 10*3/uL (ref 0.00–0.07)
BASOS ABS: 0.1 10*3/uL (ref 0.0–0.1)
Basophils Relative: 1 %
EOS ABS: 0 10*3/uL (ref 0.0–0.5)
Eosinophils Relative: 0 %
HEMATOCRIT: 45.6 % (ref 39.0–52.0)
HEMOGLOBIN: 15.5 g/dL (ref 13.0–17.0)
IMMATURE GRANULOCYTES: 1 %
LYMPHS ABS: 4.2 10*3/uL — AB (ref 0.7–4.0)
LYMPHS PCT: 41 %
MCH: 32.4 pg (ref 26.0–34.0)
MCHC: 34 g/dL (ref 30.0–36.0)
MCV: 95.4 fL (ref 80.0–100.0)
Monocytes Absolute: 1.5 10*3/uL — ABNORMAL HIGH (ref 0.1–1.0)
Monocytes Relative: 15 %
NEUTROS PCT: 42 %
NRBC: 0 % (ref 0.0–0.2)
Neutro Abs: 4.4 10*3/uL (ref 1.7–7.7)
Platelets: 102 10*3/uL — ABNORMAL LOW (ref 150–400)
RBC: 4.78 MIL/uL (ref 4.22–5.81)
RDW: 14.9 % (ref 11.5–15.5)
WBC: 10.2 10*3/uL (ref 4.0–10.5)

## 2018-09-09 LAB — COMPREHENSIVE METABOLIC PANEL
ALBUMIN: 4.9 g/dL (ref 3.5–5.0)
ALT: 186 U/L — ABNORMAL HIGH (ref 0–44)
AST: 296 U/L — AB (ref 15–41)
Alkaline Phosphatase: 102 U/L (ref 38–126)
Anion gap: 23 — ABNORMAL HIGH (ref 5–15)
BILIRUBIN TOTAL: 1.3 mg/dL — AB (ref 0.3–1.2)
BUN: 10 mg/dL (ref 6–20)
CO2: 17 mmol/L — AB (ref 22–32)
Calcium: 10.2 mg/dL (ref 8.9–10.3)
Chloride: 95 mmol/L — ABNORMAL LOW (ref 98–111)
Creatinine, Ser: 0.7 mg/dL (ref 0.61–1.24)
GFR calc Af Amer: >60 mL/min (ref >60)
GFR calc non Af Amer: >60 mL/min (ref >60)
GLUCOSE: 199 mg/dL — AB (ref 70–99)
POTASSIUM: 2.9 mmol/L — AB (ref 3.5–5.1)
SODIUM: 135 mmol/L (ref 135–145)
TOTAL PROTEIN: 8.1 g/dL (ref 6.5–8.1)

## 2018-09-09 MED ORDER — LORAZEPAM 2 MG/ML IJ SOLN
0.0000 mg | Freq: Four times a day (QID) | INTRAMUSCULAR | Status: DC
  Administered 2018-09-09: 1 mg via INTRAVENOUS
  Filled 2018-09-09: qty 1

## 2018-09-09 MED ORDER — POTASSIUM CHLORIDE CRYS ER 20 MEQ PO TBCR
40.0000 meq | EXTENDED_RELEASE_TABLET | Freq: Once | ORAL | Status: AC
  Administered 2018-09-09: 40 meq via ORAL
  Filled 2018-09-09: qty 2

## 2018-09-09 MED ORDER — THIAMINE HCL 100 MG/ML IJ SOLN: 100.0000 mg | Freq: Every day | INTRAMUSCULAR | Status: DC

## 2018-09-09 MED ORDER — LORAZEPAM 2 MG PO TABS: 0.0000 mg | ORAL_TABLET | Freq: Four times a day (QID) | ORAL | Status: DC

## 2018-09-09 MED ORDER — LORAZEPAM 2 MG PO TABS: 0.0000 mg | ORAL_TABLET | Freq: Two times a day (BID) | ORAL | Status: DC

## 2018-09-09 MED ORDER — ONDANSETRON HCL 4 MG/2ML IJ SOLN
4.0000 mg | Freq: Once | INTRAMUSCULAR | Status: AC
  Administered 2018-09-09: 4 mg via INTRAVENOUS
  Filled 2018-09-09: qty 2

## 2018-09-09 MED ORDER — VITAMIN B-1 100 MG PO TABS: 100.0000 mg | ORAL_TABLET | Freq: Every day | ORAL | Status: DC

## 2018-09-09 MED ORDER — LORAZEPAM 2 MG/ML IJ SOLN: 0.0000 mg | Freq: Two times a day (BID) | INTRAMUSCULAR | Status: DC

## 2018-09-09 MED ORDER — SODIUM CHLORIDE 0.9 % IV BOLUS
1000.0000 mL | Freq: Once | INTRAVENOUS | Status: AC
  Administered 2018-09-09: 1000 mL via INTRAVENOUS

## 2018-09-09 NOTE — ED Triage Notes (Signed)
Pt arrived from home via EMS with complaints of a seizure. He has no Hx of seizures. Pt is now alert and oriented x 4. Pt states that he was in bed seizing then he fell to the floor and he now has a hematoma to left forehead. Pt states nausea with no vomiting. EMS states that lungs are clear. EMS placed a 20 gauge in the left wrist. VS per EMS BP-160/107 HR-116 O2sat-96%RA BS-134. Pt denies any pain at this time. Pt has Hx of alcohol abuse and states that he has not had anything to eat or drink today.

## 2018-09-09 NOTE — ED Provider Notes (Signed)
[facility] Emergency Department Provider Note   ____________________________________________   I have reviewed the triage vital signs and the nursing notes.   HISTORY  Chief Complaint Seizure like activity  History limited by: Not Limited, some history obtained from partner   HPI Chad Parrish is a 58 y.o. male who presents to the emergency department today who presents to the emergency department today after apparent seizure.  His partner states that he heard a thud and when he got into the room the patient was on the floor shaking all of his extremities.  He did soil himself.  The patient himself does not recall this episode.  He states he does have a history of heavy alcohol use.  Did not drink today.  Denies any history of complicated withdrawals.  No history of seizures.  Patient is not complaining of any pain. He does feel some nausea.   Per medical record review patient has a history of alcohol use.   Past Medical History:  Diagnosis Date  . Abnormal liver function 01/2017  . Chronic hip pain 2017    Patient Active Problem List   Diagnosis Date Noted  . Acute alcoholic gastritis without hemorrhage 12/31/2015  . Avascular necrosis of bone of left hip (HCC) 12/31/2015  . Elevated liver enzymes 12/31/2015  . Uncomplicated alcohol dependence (HCC) 12/31/2015    History reviewed. No pertinent surgical history.  Prior to Admission medications   Medication Sig Start Date End Date Taking? Authorizing Provider  gabapentin (NEURONTIN) 100 MG capsule Take 1 capsule (100 mg total) by mouth 2 (two) times daily. 10/03/17   Jomarie Longs, MD  meloxicam (MOBIC) 15 MG tablet  08/14/17   [provider]  mirtazapine (REMERON) 15 MG tablet Take 0.5 tablets (7.5 mg total) by mouth at bedtime. 10/03/17   Jomarie Longs, MD  traMADol Janean Sark) 50 MG tablet  09/19/17   [provider]    Allergies Opium  Family History  Problem Relation Age of  Onset  . Mental illness Maternal Aunt     Social History Social History   Tobacco Use  . Smoking status: Never Smoker  . Smokeless tobacco: Never Used  Substance Use Topics  . Alcohol use: Yes    Alcohol/week: 70.0 standard drinks    Types: 70 Cans of beer per week    Comment: 10 beers a day / or more. everyday  . Drug use: Yes    Types: Marijuana    Review of Systems Constitutional: No fever/chills Eyes: No visual changes. ENT: No sore throat. Cardiovascular: Denies chest pain. Respiratory: Denies shortness of breath. Gastrointestinal: No abdominal pain.  Positive for nausea.   Genitourinary: Negative for dysuria. Musculoskeletal: Negative for back pain. Skin: Negative for rash. Neurological: Negative for headaches, focal weakness or numbness.  ____________________________________________   PHYSICAL EXAM:  VITAL SIGNS: ED Triage Vitals  Enc Vitals Group     BP 09/09/18 2147 (!) 149/95     Pulse Rate 09/09/18 2145 (!) 103     Resp 09/09/18 2145 15     Temp 09/09/18 2145 97.6 F (36.4 C)     Temp Source 09/09/18 2145 Oral     SpO2 09/09/18 2145 97 %     Weight 09/09/18 2146 165 lb (74.8 kg)     Height 09/09/18 2146 5\' 6"  (1.676 m)     Head Circumference --      Peak Flow --      Pain Score 09/09/18 2145 0   Constitutional:  Alert and oriented.  Eyes: Conjunctivae are normal.  ENT      Head: Normocephalic and atraumatic.      Nose: No congestion/rhinnorhea.      Mouth/Throat: Mucous membranes are moist.      Neck: No stridor. Hematological/Lymphatic/Immunilogical: No cervical lymphadenopathy. Cardiovascular: Normal rate, regular rhythm.  No murmurs, rubs, or gallops. Respiratory: Normal respiratory effort without tachypnea nor retractions. Breath sounds are clear and equal bilaterally. No wheezes/rales/rhonchi. Gastrointestinal: Soft and non tender. No rebound. No guarding.  Genitourinary: Deferred Musculoskeletal: Normal range of motion in all  extremities. No lower extremity edema. Neurologic:  Normal speech and language. Diffuse tremors.  Skin:  Skin is warm, dry and intact. No rash noted. Psychiatric: Mood and affect are normal. Speech and behavior are normal. Patient exhibits appropriate insight and judgment.  ____________________________________________    LABS (pertinent positives/negatives)  CBC wbc 10.2, hgb 15.5, plt 102 CMP na 135, k 2.9, glu 199, cr 0.70  ____________________________________________   EKG  None  ____________________________________________    RADIOLOGY  CT head No acute findings.   ____________________________________________   PROCEDURES  Procedures  ____________________________________________   INITIAL IMPRESSION / ASSESSMENT AND PLAN / ED COURSE  Pertinent labs & imaging results that were available during my care of the patient were reviewed by me and considered in my medical decision making (see chart for details).   Patient presented to the emergency department today because of concern for seizure. Patient does have significant alcohol use but did not use today. On exam patient without any significant traumatic injury. Blood work shows slightly low potassium. Will plan on admission. Discussed with patient.   ____________________________________________   FINAL CLINICAL IMPRESSION(S) / ED DIAGNOSES  Final diagnoses:  Seizure (HCC)  Alcohol withdrawal syndrome with complication [facility])     Note: This dictation was prepared with Dragon dictation. Any transcriptional errors that result from this process are unintentional     Phineas Semen, MD 09/09/18 [phone]

## 2018-09-09 NOTE — ED Notes (Signed)
Cleaned pt

## 2018-09-10 ENCOUNTER — Observation Stay: Payer: Medicaid Other

## 2018-09-10 ENCOUNTER — Other Ambulatory Visit: Payer: Self-pay

## 2018-09-10 ENCOUNTER — Encounter: Payer: Self-pay | Admitting: Internal Medicine

## 2018-09-10 DIAGNOSIS — F10939 Alcohol use, unspecified with withdrawal, unspecified: Secondary | ICD-10-CM

## 2018-09-10 DIAGNOSIS — R569 Unspecified convulsions: Secondary | ICD-10-CM | POA: Diagnosis not present

## 2018-09-10 DIAGNOSIS — F3289 Other specified depressive episodes: Secondary | ICD-10-CM

## 2018-09-10 DIAGNOSIS — F10239 Alcohol dependence with withdrawal, unspecified: Secondary | ICD-10-CM | POA: Diagnosis not present

## 2018-09-10 DIAGNOSIS — F10230 Alcohol dependence with withdrawal, uncomplicated: Secondary | ICD-10-CM | POA: Diagnosis not present

## 2018-09-10 LAB — BASIC METABOLIC PANEL
ANION GAP: 9 (ref 5–15)
BUN: 7 mg/dL (ref 6–20)
CO2: 27 mmol/L (ref 22–32)
Calcium: 9.5 mg/dL (ref 8.9–10.3)
Chloride: 103 mmol/L (ref 98–111)
Creatinine, Ser: 0.53 mg/dL — ABNORMAL LOW (ref 0.61–1.24)
GFR calc Af Amer: >60 mL/min (ref >60)
Glucose, Bld: 110 mg/dL — ABNORMAL HIGH (ref 70–99)
POTASSIUM: 3.7 mmol/L (ref 3.5–5.1)
Sodium: 139 mmol/L (ref 135–145)

## 2018-09-10 LAB — URINE DRUG SCREEN, QUALITATIVE (ARMC ONLY)
Amphetamines, Ur Screen: NOT DETECTED
BENZODIAZEPINE, UR SCRN: NOT DETECTED
Barbiturates, Ur Screen: NOT DETECTED
COCAINE METABOLITE, UR ~~LOC~~: NOT DETECTED
Cannabinoid 50 Ng, Ur ~~LOC~~: NOT DETECTED
MDMA (Ecstasy)Ur Screen: NOT DETECTED
Methadone Scn, Ur: NOT DETECTED
Opiate, Ur Screen: NOT DETECTED
Phencyclidine (PCP) Ur S: NOT DETECTED
Tricyclic, Ur Screen: NOT DETECTED

## 2018-09-10 LAB — CBC
HCT: 42.7 % (ref 39.0–52.0)
Hemoglobin: 14.7 g/dL (ref 13.0–17.0)
MCH: 32.2 pg (ref 26.0–34.0)
MCHC: 34.4 g/dL (ref 30.0–36.0)
MCV: 93.4 fL (ref 80.0–100.0)
Platelets: 77 10*3/uL — ABNORMAL LOW (ref 150–400)
RBC: 4.57 MIL/uL (ref 4.22–5.81)
RDW: 14.9 % (ref 11.5–15.5)
WBC: 6.8 10*3/uL (ref 4.0–10.5)
nRBC: 0 % (ref 0.0–0.2)

## 2018-09-10 LAB — GLUCOSE, CAPILLARY: Glucose-Capillary: 114 mg/dL — ABNORMAL HIGH (ref 70–99)

## 2018-09-10 MED ORDER — CHLORDIAZEPOXIDE HCL 5 MG PO CAPS
10.0000 mg | ORAL_CAPSULE | Freq: Four times a day (QID) | ORAL | Status: DC
  Administered 2018-09-10 (×3): 10 mg via ORAL
  Filled 2018-09-10 (×3): qty 2

## 2018-09-10 MED ORDER — DEXMEDETOMIDINE HCL IN NACL 200 MCG/50ML IV SOLN
0.2000 ug/kg/h | INTRAVENOUS | Status: DC
  Administered 2018-09-10: 0.7 ug/kg/h via INTRAVENOUS
  Administered 2018-09-11: 0.6 ug/kg/h via INTRAVENOUS
  Administered 2018-09-11: 0.7 ug/kg/h via INTRAVENOUS
  Administered 2018-09-12: 0.4 ug/kg/h via INTRAVENOUS
  Filled 2018-09-10: qty 50

## 2018-09-10 MED ORDER — ONDANSETRON HCL 4 MG PO TABS: 4.0000 mg | ORAL_TABLET | Freq: Four times a day (QID) | ORAL | Status: DC | PRN

## 2018-09-10 MED ORDER — SODIUM CHLORIDE 0.9 % IV SOLN
INTRAVENOUS | Status: AC
  Administered 2018-09-10: 02:00:00 via INTRAVENOUS

## 2018-09-10 MED ORDER — CHLORDIAZEPOXIDE HCL 5 MG PO CAPS
25.0000 mg | ORAL_CAPSULE | Freq: Four times a day (QID) | ORAL | Status: DC
  Administered 2018-09-10 – 2018-09-11 (×3): 25 mg via ORAL
  Filled 2018-09-10: qty 5
  Filled 2018-09-10 (×3): qty 2

## 2018-09-10 MED ORDER — ACETAMINOPHEN 650 MG RE SUPP: 650.0000 mg | Freq: Four times a day (QID) | RECTAL | Status: DC | PRN

## 2018-09-10 MED ORDER — ADULT MULTIVITAMIN W/MINERALS CH
1.0000 | ORAL_TABLET | Freq: Every day | ORAL | Status: DC
  Administered 2018-09-12 – 2018-09-14 (×3): 1 via ORAL
  Filled 2018-09-10 (×3): qty 1

## 2018-09-10 MED ORDER — CHLORDIAZEPOXIDE HCL 5 MG PO CAPS: 25.0000 mg | ORAL_CAPSULE | Freq: Four times a day (QID) | ORAL | Status: DC | PRN

## 2018-09-10 MED ORDER — LOPERAMIDE HCL 2 MG PO CAPS: 2.0000 mg | ORAL_CAPSULE | ORAL | Status: AC | PRN

## 2018-09-10 MED ORDER — ADULT MULTIVITAMIN W/MINERALS CH
1.0000 | ORAL_TABLET | Freq: Every day | ORAL | Status: DC
  Administered 2018-09-10: 1 via ORAL
  Filled 2018-09-10: qty 1

## 2018-09-10 MED ORDER — LORAZEPAM 1 MG PO TABS
1.0000 mg | ORAL_TABLET | Freq: Four times a day (QID) | ORAL | Status: AC | PRN
  Administered 2018-09-10: 18:00:00 1 mg via ORAL
  Filled 2018-09-10: qty 1

## 2018-09-10 MED ORDER — THIAMINE HCL 100 MG/ML IJ SOLN
100.0000 mg | Freq: Once | INTRAMUSCULAR | Status: AC
  Administered 2018-09-10: 100 mg via INTRAMUSCULAR
  Filled 2018-09-10: qty 2

## 2018-09-10 MED ORDER — MIRTAZAPINE 15 MG PO TABS
15.0000 mg | ORAL_TABLET | Freq: Every day | ORAL | Status: DC
  Administered 2018-09-10: 15 mg via ORAL
  Filled 2018-09-10: qty 1

## 2018-09-10 MED ORDER — ONDANSETRON 4 MG PO TBDP
4.0000 mg | ORAL_TABLET | Freq: Four times a day (QID) | ORAL | Status: AC | PRN
  Filled 2018-09-10: qty 1

## 2018-09-10 MED ORDER — ENOXAPARIN SODIUM 40 MG/0.4ML ~~LOC~~ SOLN
40.0000 mg | SUBCUTANEOUS | Status: DC
  Administered 2018-09-10: 11:00:00 40 mg via SUBCUTANEOUS
  Filled 2018-09-10: qty 0.4

## 2018-09-10 MED ORDER — ONDANSETRON HCL 4 MG/2ML IJ SOLN: 4.0000 mg | Freq: Four times a day (QID) | INTRAMUSCULAR | Status: DC | PRN

## 2018-09-10 MED ORDER — LORAZEPAM 2 MG/ML IJ SOLN
2.0000 mg | INTRAMUSCULAR | Status: DC | PRN
  Administered 2018-09-12: 2 mg via INTRAVENOUS
  Filled 2018-09-10 (×2): qty 1

## 2018-09-10 MED ORDER — ACETAMINOPHEN 325 MG PO TABS: 650.0000 mg | ORAL_TABLET | Freq: Four times a day (QID) | ORAL | Status: DC | PRN

## 2018-09-10 MED ORDER — LORAZEPAM 2 MG/ML IJ SOLN
4.0000 mg | Freq: Once | INTRAMUSCULAR | Status: AC
  Administered 2018-09-10: 4 mg via INTRAVENOUS

## 2018-09-10 MED ORDER — VITAMIN B-1 100 MG PO TABS
100.0000 mg | ORAL_TABLET | Freq: Every day | ORAL | Status: DC
  Administered 2018-09-13 – 2018-09-14 (×2): 100 mg via ORAL
  Filled 2018-09-10 (×2): qty 1

## 2018-09-10 MED ORDER — LORAZEPAM 2 MG/ML IJ SOLN
0.0000 mg | Freq: Two times a day (BID) | INTRAMUSCULAR | Status: AC
  Administered 2018-09-12: 2 mg via INTRAVENOUS
  Administered 2018-09-13: 4 mg via INTRAVENOUS
  Filled 2018-09-10: qty 1
  Filled 2018-09-10: qty 2
  Filled 2018-09-10 (×4): qty 1

## 2018-09-10 MED ORDER — VITAMIN B-1 100 MG PO TABS
100.0000 mg | ORAL_TABLET | Freq: Every day | ORAL | Status: DC
  Administered 2018-09-10: 100 mg via ORAL
  Filled 2018-09-10: qty 1

## 2018-09-10 MED ORDER — THIAMINE HCL 100 MG/ML IJ SOLN
100.0000 mg | Freq: Every day | INTRAMUSCULAR | Status: DC
  Administered 2018-09-11 – 2018-09-12 (×2): 100 mg via INTRAVENOUS
  Filled 2018-09-10 (×2): qty 2

## 2018-09-10 MED ORDER — HYDROXYZINE HCL 25 MG PO TABS
25.0000 mg | ORAL_TABLET | Freq: Four times a day (QID) | ORAL | Status: AC | PRN
  Administered 2018-09-10: 21:00:00 25 mg via ORAL
  Filled 2018-09-10 (×2): qty 1

## 2018-09-10 MED ORDER — MIRTAZAPINE 15 MG PO TABS: 7.5000 mg | ORAL_TABLET | Freq: Every day | ORAL | Status: DC

## 2018-09-10 MED ORDER — LORAZEPAM 2 MG/ML IJ SOLN
0.0000 mg | Freq: Four times a day (QID) | INTRAMUSCULAR | Status: AC
  Administered 2018-09-10 (×2): 2 mg via INTRAVENOUS
  Administered 2018-09-10 (×2): 4 mg via INTRAVENOUS
  Administered 2018-09-11: 2 mg via INTRAVENOUS
  Administered 2018-09-11: 1 mg via INTRAVENOUS
  Filled 2018-09-10 (×2): qty 1
  Filled 2018-09-10: qty 2
  Filled 2018-09-10: qty 1
  Filled 2018-09-10 (×2): qty 2
  Filled 2018-09-10: qty 1

## 2018-09-10 MED ORDER — LORAZEPAM 2 MG/ML IJ SOLN
1.0000 mg | Freq: Four times a day (QID) | INTRAMUSCULAR | Status: AC | PRN
  Administered 2018-09-10 – 2018-09-12 (×3): 1 mg via INTRAVENOUS
  Filled 2018-09-10: qty 1

## 2018-09-10 MED ORDER — FOLIC ACID 1 MG PO TABS
1.0000 mg | ORAL_TABLET | Freq: Every day | ORAL | Status: DC
  Administered 2018-09-10 – 2018-09-14 (×4): 1 mg via ORAL
  Filled 2018-09-10 (×4): qty 1

## 2018-09-10 NOTE — Care Management Note (Signed)
Case Management Note  Patient Details  Name: Chad Parrish MRN: [id number] Date of Birth: [birth year]  Subjective/Objective:                  Met with patient to discuss DC plan and needs He states that he has no need for equipment at home He states that he does not need HH for anything He states that he does not have a PCP due to not having insurance I sent a message inbox [facility]  Provided the patient with an application  Patient has transportation  Patient has a partner at home and does not live alone    Action/Plan:  [facility] made, application given to the patient  Expected Discharge Date:                  Expected Discharge Plan:     In-House Referral:     Discharge planning Services  CM Consult  Post Acute Care Choice:    Choice offered to:     DME Arranged:    DME Agency:     HH Arranged:    [place] Agency:     Status of Service:  In process, will continue to follow  If discussed at Long Length of Stay Meetings, dates discussed:    Additional Comments:  Su Hilt, RN 09/10/2018, 12:14 PM

## 2018-09-10 NOTE — Progress Notes (Signed)
[facility] - [place] [facility] [place]   PATIENT NAME: Chad Parrish    MR#:  [id number]  DATE OF BIRTH:  1961-05-09  SUBJECTIVE:  CHIEF COMPLAINT: Patient is resting comfortably.  Jittery and hungry denies any nausea vomiting and wants to eat  REVIEW OF SYSTEMS:  CONSTITUTIONAL: No fever, fatigue or weakness.  EYES: No blurred or double vision.  EARS, NOSE, AND THROAT: No tinnitus or ear pain.  RESPIRATORY: No cough, shortness of breath, wheezing or hemoptysis.  CARDIOVASCULAR: No chest pain, orthopnea, edema.  GASTROINTESTINAL: No nausea, vomiting, diarrhea or abdominal pain.  GENITOURINARY: No dysuria, hematuria.  ENDOCRINE: No polyuria, nocturia,  HEMATOLOGY: No anemia, easy bruising or bleeding SKIN: No rash or lesion. MUSCULOSKELETAL: No joint pain or arthritis.   NEUROLOGIC: No tingling, numbness, weakness.  PSYCHIATRY: No anxiety or depression.   DRUG ALLERGIES:   Allergies  Allergen Reactions  . Opium Itching    VITALS:  Blood pressure 115/74, pulse 94, temperature 99.2 F (37.3 C), temperature source Oral, resp. rate 18, height 5\' 6"  (1.676 m), weight 74.8 kg, SpO2 94 %.  PHYSICAL EXAMINATION:  GENERAL:  58 y.o.-year-old patient lying in the bed with no acute distress.  EYES: Pupils equal, round, reactive to light and accommodation. No scleral icterus. Extraocular muscles intact.  HEENT: Head atraumatic, normocephalic. Oropharynx and nasopharynx clear.  NECK:  Supple, no jugular venous distention. No thyroid enlargement, no tenderness.  LUNGS: Normal breath sounds bilaterally, no wheezing, rales,rhonchi or crepitation. No use of accessory muscles of respiration.  CARDIOVASCULAR: S1, S2 normal. No murmurs, rubs, or gallops.  ABDOMEN: Soft, nontender, nondistended. Bowel sounds present.   EXTREMITIES: No pedal edema, cyanosis, or clubbing.  NEUROLOGIC: Awake, alert and oriented x3 sensation intact. Gait not checked.  Jittery PSYCHIATRIC: The  patient is alert and oriented x 3.  SKIN: No obvious rash, lesion, or ulcer.    LABORATORY PANEL:   CBC Recent Labs  Lab 09/10/18 0349  WBC 6.8  HGB 14.7  HCT 42.7  PLT 77*   ------------------------------------------------------------------------------------------------------------------  Chemistries  Recent Labs  Lab 09/09/18 2148 09/10/18 0349  NA 135 139  K 2.9* 3.7  CL 95* 103  CO2 17* 27  GLUCOSE 199* 110*  BUN 10 7  CREATININE 0.70 0.53*  CALCIUM 10.2 9.5  AST 296*  --   ALT 186*  --   ALKPHOS 102  --   BILITOT 1.3*  --    ------------------------------------------------------------------------------------------------------------------  Cardiac Enzymes No results for input(s): TROPONINI in the last 168 hours. ------------------------------------------------------------------------------------------------------------------  RADIOLOGY:  Ct Head Wo Contrast  Result Date: 09/09/2018 CLINICAL DATA:  Seizure. EXAM: CT HEAD WITHOUT CONTRAST TECHNIQUE: Contiguous axial images were obtained from the base of the skull through the vertex without intravenous contrast. COMPARISON:  None. FINDINGS: Brain: No evidence of acute infarction, hemorrhage, hydrocephalus, extra-axial collection or mass lesion/mass effect. Vascular: Mild calcific atherosclerotic disease of the intra cavernous carotid arteries. Skull: Normal. Negative for fracture or focal lesion. Sinuses/Orbits: No acute finding. Other: None. IMPRESSION: No acute intracranial abnormality. Electronically Signed   By: Ted Mcalpine M.D.   On: 09/09/2018 23:04    EKG:   Orders placed or performed during the hospital encounter of 11/15/15  . EKG 12-Lead  . EKG 12-Lead    ASSESSMENT AND PLAN:    #Seizure seems to be from alcohol withdrawal CIWA protocol Benzodiazepine for withdrawal Neurology consult placed CT head negative  #Hypokalemia Resolved and potassium is at 3.7 Check magnesium  level  #Generalized  weakness Patient is on multivitamin thiamine and folate PT consult placed  #Thrombocytopenia-probably from alcohol abuse Avoid antiplatelet agents    All the records are reviewed and case discussed with Care Management/Social Workerr. Management plans discussed with the patient, family and they are in agreement.  CODE STATUS: fc   TOTAL TIME TAKING CARE OF THIS PATIENT: 35  minutes.   POSSIBLE D/C IN 1 DAYS, DEPENDING ON CLINICAL CONDITION.  Note: This dictation was prepared with Dragon dictation along with smaller phrase technology. Any transcriptional errors that result from this process are unintentional.   Ramonita Lab M.D on 09/10/2018 at 1:18 PM  Between 7am to 6pm - Pager - [phone] After 6pm go to [email or web address] - password [email or web address]  [facility]  Office  [phone]  CC: Primary care physician; Patient, No Pcp Per

## 2018-09-10 NOTE — Care Management (Signed)
Sent IM to Milagros ReapBetty Klutz with [facility] requesting to accept the patient when he discharges as he has no insurance and no way of paying for medications. Provided the patient with the application to take with him

## 2018-09-10 NOTE — Progress Notes (Signed)
Requested MD to bedside to evaluate patient. Patient has vigorous tremors noted in upper extremities, diaphoresis, and anxiety. New order for psych consult to evaluate and assist with med management. Tele sitter to bedside at this time.

## 2018-09-10 NOTE — Progress Notes (Signed)
eLink Physician-Brief Progress Note Patient Name: Chad Parrish DOB: July 23, 1961 MRN: [id number]   Date of Service  09/10/2018  HPI/Events of Note  58 y/o ETOH abuse presented with seizures likely from alcohol withdrawal. He was started on benzos but remains agitated requiring Precedex drip hence transferred to ICU.  eICU Interventions  CIWA protocol Electrolytes corrected     Intervention Category Major Interventions: Delirium, psychosis, severe agitation - evaluation and management Evaluation Type: New Patient Evaluation  Darl Pikes 09/10/2018, 11:53 PM

## 2018-09-10 NOTE — Consult Note (Addendum)
Reason for Consult: Alcohol withdrawal seizures Referring Physician: Ramonita Lab MD  CC: Seizures  HPI: Chad Parrish is an 58 y.o. male with past medical history of alcohol abuse presenting to the ED  On 09/09/2018 with witnessed seizure-like activity.  Patient does not recall the event so history mostly obtained from patient's chart and initial ED reports.  Patient report that he has been drinking 8-10 bottles of beer every day however he was feeling unwell yesterday and did not drink any alcohol.  Per ED reports patient's partner heard a loud thud and when he went to the room found patient on the floor seizing.  He apparently was sleeping on the bed when seizure occurred causing him to fall on the floor.  He lost control of his bladder and sustained a laceration to the back of his head.  Patient denies history of seizures.  On arrival to the ED he was noted to be alert and oriented x4.  Vital signs per EMS BP-160/107 HR-116 O2sat-96%RA BS-134.  Initial CT head was negative for acute intracranial abnormality.  Initial labs revealed potassium 2.9, glucose 199, AST 296, ALT 186, platelets 102, UDS negative.  Due to history of alcohol abuse patient was admitted for suspected alcohol withdrawal seizures.  Past Medical History:  Diagnosis Date  . Abnormal liver function 01/2017  . Chronic hip pain 2017    Past Surgical History:  Procedure Laterality Date  . NO PAST SURGERIES      Family History  Problem Relation Age of Onset  . Lung cancer Mother   . Mental illness Maternal Aunt     Social History:  reports that he has never smoked. He has never used smokeless tobacco. He reports current alcohol use of about 70.0 standard drinks of alcohol per week. He reports current drug use. Drug: Marijuana.  Allergies  Allergen Reactions  . Opium Itching    Medications:  I have reviewed the patient's current medications. Prior to Admission:  Medications Prior to Admission  Medication Sig Dispense  Refill Last Dose  . gabapentin (NEURONTIN) 100 MG capsule Take 1 capsule (100 mg total) by mouth 2 (two) times daily. (Patient not taking: Reported on 09/10/2018) 60 capsule 1 Not Taking at Unknown time  . mirtazapine (REMERON) 15 MG tablet Take 0.5 tablets (7.5 mg total) by mouth at bedtime. (Patient not taking: Reported on 09/10/2018) 15 tablet 1 Not Taking at Unknown time   Scheduled: . chlordiazePOXIDE  10 mg Oral QID  . enoxaparin (LOVENOX) injection  40 mg Subcutaneous Q24H  . folic acid  1 mg Oral Daily  . LORazepam  0-4 mg Intravenous Q6H   Followed by  . [START ON 09/12/2018] LORazepam  0-4 mg Intravenous Q12H  . multivitamin with minerals  1 tablet Oral Daily  . thiamine  100 mg Oral Daily   Or  . thiamine  100 mg Intravenous Daily    ROS: History obtained from the patient   General ROS: negative for - chills, fatigue, fever, night sweats, weight gain or weight loss Psychological ROS: negative for - behavioral disorder, hallucinations, memory difficulties, mood swings or suicidal ideation Ophthalmic ROS: negative for - blurry vision, double vision, eye pain or loss of vision ENT ROS: negative for - epistaxis, nasal discharge, oral lesions, sore throat, tinnitus or vertigo Allergy and Immunology ROS: negative for - hives or itchy/watery eyes Hematological and Lymphatic ROS: negative for - bleeding problems, bruising or swollen lymph nodes Endocrine ROS: negative for - galactorrhea, hair pattern changes,  polydipsia/polyuria or temperature intolerance Respiratory ROS: negative for - cough, hemoptysis, shortness of breath or wheezing Cardiovascular ROS: negative for - chest pain, dyspnea on exertion, edema or irregular heartbeat Gastrointestinal ROS: negative for - abdominal pain, diarrhea, hematemesis, nausea/vomiting or stool incontinence Genito-Urinary ROS: negative for - dysuria, hematuria, incontinence or urinary frequency/urgency Musculoskeletal ROS: negative for - joint  swelling or muscular weakness Neurological ROS: as noted in HPI Dermatological ROS: negative for rash and skin lesion changes  Physical Examination: Blood pressure 115/74, pulse 94, temperature 99.2 F (37.3 C), temperature source Oral, resp. rate 18, height 5\' 6"  (1.676 m), weight 74.8 kg, SpO2 94 %.  HEENT-  Normocephalic, no lesions, without obvious abnormality.  Normal external eye and conjunctiva.  Normal TM's bilaterally.  Normal auditory canals and external ears. Normal external nose, mucus membranes and septum.  Normal pharynx. Cardiovascular- S1, S2 normal, pulses palpable throughout   Lungs- chest clear, no wheezing, rales, normal symmetric air entry Abdomen- soft, non-tender; bowel sounds normal; no masses,  no organomegaly Extremities- no edema Lymph-no adenopathy palpable Musculoskeletal-no joint tenderness, deformity or swelling Skin-warm and dry, no hyperpigmentation, vitiligo, or suspicious lesions  Neurological Exam   Mental Status: Alert, oriented, thought content appropriate.  Speech fluent without evidence of aphasia.  Able to follow 3 step commands without difficulty. Attention span and concentration seemed appropriate  Cranial Nerves: II: Discs flat bilaterally; Visual fields grossly normal, pupils equal, round, reactive to light and accommodation III,IV, VI: ptosis not present, extra-ocular motions intact bilaterally V,VII: smile symmetric, facial light touch sensation intact VIII: hearing normal bilaterally IX,X: gag reflex present XI: bilateral shoulder shrug XII: midline tongue extension Motor: Right :  Upper extremity   5/5 Without pronator drift      Left: Upper extremity   5/5 without pronator drift Right:   Lower extremity   5/5                                          Left: Lower extremity   5/5 Bilateral upper and lower extremities mild-moderate tremors Tone and bulk:normal tone throughout; no atrophy noted Sensory: Pinprick and light touch intact  bilaterally Deep Tendon Reflexes: 2+ and symmetric throughout Plantars: Right: mute                              Left: mute Cerebellar: Finger-to-nose testing intact bilaterally. Heel to shin testing normal bilaterally Gait: not tested due to safety concerns  Data Reviewed  Laboratory Studies:   Basic Metabolic Panel: Recent Labs  Lab 09/09/18 2148 09/10/18 0349  NA 135 139  K 2.9* 3.7  CL 95* 103  CO2 17* 27  GLUCOSE 199* 110*  BUN 10 7  CREATININE 0.70 0.53*  CALCIUM 10.2 9.5    Liver Function Tests: Recent Labs  Lab 09/09/18 2148  AST 296*  ALT 186*  ALKPHOS 102  BILITOT 1.3*  PROT 8.1  ALBUMIN 4.9   No results for input(s): LIPASE, AMYLASE in the last 168 hours. No results for input(s): AMMONIA in the last 168 hours.  CBC: Recent Labs  Lab 09/09/18 2148 09/10/18 0349  WBC 10.2 6.8  NEUTROABS 4.4  --   HGB 15.5 14.7  HCT 45.6 42.7  MCV 95.4 93.4  PLT 102* 77*    Cardiac Enzymes: No results for input(s): CKTOTAL, CKMB, CKMBINDEX, TROPONINI in  the last 168 hours.  BNP: Invalid input(s): POCBNP  CBG: No results for input(s): GLUCAP in the last 168 hours.  Microbiology: No results found for this or any previous visit.  Coagulation Studies: No results for input(s): LABPROT, INR in the last 72 hours.  Urinalysis: No results for input(s): COLORURINE, LABSPEC, PHURINE, GLUCOSEU, HGBUR, BILIRUBINUR, KETONESUR, PROTEINUR, UROBILINOGEN, NITRITE, LEUKOCYTESUR in the last 168 hours.  Invalid input(s): APPERANCEUR  Lipid Panel:  No results found for: CHOL, TRIG, HDL, CHOLHDL, VLDL, LDLCALC  HgbA1C: No results found for: HGBA1C  Urine Drug Screen:      Component Value Date/Time   LABOPIA NONE DETECTED 09/09/2018 2148   COCAINSCRNUR NONE DETECTED 09/09/2018 2148   LABBENZ NONE DETECTED 09/09/2018 2148   AMPHETMU NONE DETECTED 09/09/2018 2148   THCU NONE DETECTED 09/09/2018 2148   LABBARB NONE DETECTED 09/09/2018 2148    Alcohol Level: No  results for input(s): ETH in the last 168 hours.  Other results: EKG: normal EKG, normal sinus rhythm, unchanged from previous tracings.  Imaging: Ct Head Wo Contrast  Result Date: 09/09/2018 CLINICAL DATA:  Seizure. EXAM: CT HEAD WITHOUT CONTRAST TECHNIQUE: Contiguous axial images were obtained from the base of the skull through the vertex without intravenous contrast. COMPARISON:  None. FINDINGS: Brain: No evidence of acute infarction, hemorrhage, hydrocephalus, extra-axial collection or mass lesion/mass effect. Vascular: Mild calcific atherosclerotic disease of the intra cavernous carotid arteries. Skull: Normal. Negative for fracture or focal lesion. Sinuses/Orbits: No acute finding. Other: None. IMPRESSION: No acute intracranial abnormality. Electronically Signed   By: Ted Mcalpineobrinka  Dimitrova M.D.   On: 09/09/2018 23:04   Assessment: 58 y.o male with past medical history of alcohol abuse presenting to the ED  On 09/09/2018 with witnessed seizure-like activity.  Etiology likely alcohol withdrawal seizures.  No new episodes of seizures or seizure-like activity.No associated symptoms of withdrawal such as nausea vomiting, auditory, tactile or visual disturbances, paroxysmal sweats, agitation, headache anxiety.  He however does have visible bilateral upper and lower extremity tremors.  Since seizure is likely provoked, no further work-up or antiseizure medication indicated at this time.  Plan: 1. Folate and thiamine replacement 2. Seizure precautions 3. Ativan prn for seizure activity and per CIWA protocol 4. Patient would benefit from resources for Assistance in Long-Term Abstinence from Alcohol Use.  He is in agreement with enrolling in our program assist with alcohol cessation.  This patient was staffed with Dr. Loretha BrasilZeylikman, Doyle AskewYuriy who personally evaluated patient, reviewed documentation and agreed with assessment and plan of care as above.  Webb SilversmithElizabeth Krishna Heuer, DNP, FNP-BC Board certified Nurse  Practitioner Neurology Department  09/10/2018, 11:23 AM

## 2018-09-10 NOTE — H&P (Signed)
[facility] - [place] [facility]   PATIENT NAME: Chad Parrish    MR#:  [id number]  DATE OF BIRTH:  March 14, 1961  DATE OF ADMISSION:  09/09/2018  PRIMARY CARE PHYSICIAN: Patient, No Pcp Per   REQUESTING/REFERRING PHYSICIAN: Derrill Kay, MD  CHIEF COMPLAINT:  Seizure  HISTORY OF PRESENT ILLNESS:  Chad Parrish  is a 58 y.o. male who presents with chief complaint as above.  Patient presents to the ED after seizure.  He has a history of significant alcohol use, and states that he did not drink for the last 24 hours.  He is currently oriented, though he does not remember the seizure event.  He does show signs of withdrawal here in the ED.  Hospitalist were called for admission  PAST MEDICAL HISTORY:   Past Medical History:  Diagnosis Date  . Abnormal liver function 01/2017  . Chronic hip pain 2017     PAST SURGICAL HISTORY:   Past Surgical History:  Procedure Laterality Date  . NO PAST SURGERIES       SOCIAL HISTORY:   Social History   Tobacco Use  . Smoking status: Never Smoker  . Smokeless tobacco: Never Used  Substance Use Topics  . Alcohol use: Yes    Alcohol/week: 70.0 standard drinks    Types: 70 Cans of beer per week    Comment: 10 beers a day / or more. everyday     FAMILY HISTORY:   Family History  Problem Relation Age of Onset  . Lung cancer Mother   . Mental illness Maternal Aunt      DRUG ALLERGIES:   Allergies  Allergen Reactions  . Opium Itching    MEDICATIONS AT HOME:   Prior to Admission medications   Medication Sig Start Date End Date Taking? Authorizing Provider  gabapentin (NEURONTIN) 100 MG capsule Take 1 capsule (100 mg total) by mouth 2 (two) times daily. Patient not taking: Reported on 09/10/2018 10/03/17   Jomarie Longs, MD  mirtazapine (REMERON) 15 MG tablet Take 0.5 tablets (7.5 mg total) by mouth at bedtime. Patient not taking: Reported on 09/10/2018 10/03/17   Jomarie Longs, MD    REVIEW OF SYSTEMS:   Review of Systems  Constitutional: Negative for chills, fever, malaise/fatigue and weight loss.  HENT: Negative for ear pain, hearing loss and tinnitus.   Eyes: Negative for blurred vision, double vision, pain and redness.  Respiratory: Negative for cough, hemoptysis and shortness of breath.   Cardiovascular: Negative for chest pain, palpitations, orthopnea and leg swelling.  Gastrointestinal: Negative for abdominal pain, constipation, diarrhea, nausea and vomiting.  Genitourinary: Negative for dysuria, frequency and hematuria.  Musculoskeletal: Negative for back pain, joint pain and neck pain.  Skin:       No acne, rash, or lesions  Neurological: Positive for seizures. Negative for dizziness, tremors, focal weakness and weakness.  Endo/Heme/Allergies: Negative for polydipsia. Does not bruise/bleed easily.  Psychiatric/Behavioral: Negative for depression. The patient is not nervous/anxious and does not have insomnia.      VITAL SIGNS:   Vitals:   09/09/18 2230 09/09/18 2330 09/10/18 0000 09/10/18 0030  BP: 139/90 137/80 135/86 136/86  Pulse:  87 86 89  Resp: 15   15  Temp:      TempSrc:      SpO2:  97% 93% 95%  Weight:      Height:       Wt Readings from Last 3 Encounters:  09/09/18 74.8 kg  11/15/15 77.1 kg  PHYSICAL EXAMINATION:  Physical Exam  Vitals reviewed. Constitutional: He is oriented to person, place, and time. He appears well-developed and well-nourished. No distress.  HENT:  Head: Normocephalic and atraumatic.  Mouth/Throat: Oropharynx is clear and moist.  Eyes: Pupils are equal, round, and reactive to light. Conjunctivae and EOM are normal. No scleral icterus.  Neck: Normal range of motion. Neck supple. No JVD present. No thyromegaly present.  Cardiovascular: Normal rate, regular rhythm and intact distal pulses. Exam reveals no gallop and no friction rub.  No murmur heard. Respiratory: Effort normal and breath sounds normal. No respiratory distress. He  has no wheezes. He has no rales.  GI: Soft. Bowel sounds are normal. He exhibits no distension. There is no abdominal tenderness.  Musculoskeletal: Normal range of motion.        General: No edema.     Comments: No arthritis, no gout  Lymphadenopathy:    He has no cervical adenopathy.  Neurological: He is alert and oriented to person, place, and time. No cranial nerve deficit.  No dysarthria, no aphasia  Skin: Skin is warm and dry. No rash noted. No erythema.  Psychiatric: He has a normal mood and affect. His behavior is normal. Judgment and thought content normal.    LABORATORY PANEL:   CBC Recent Labs  Lab 09/09/18 2148  WBC 10.2  HGB 15.5  HCT 45.6  PLT 102*   ------------------------------------------------------------------------------------------------------------------  Chemistries  Recent Labs  Lab 09/09/18 2148  NA 135  K 2.9*  CL 95*  CO2 17*  GLUCOSE 199*  BUN 10  CREATININE 0.70  CALCIUM 10.2  AST 296*  ALT 186*  ALKPHOS 102  BILITOT 1.3*   ------------------------------------------------------------------------------------------------------------------  Cardiac Enzymes No results for input(s): TROPONINI in the last 168 hours. ------------------------------------------------------------------------------------------------------------------  RADIOLOGY:  Ct Head Wo Contrast  Result Date: 09/09/2018 CLINICAL DATA:  Seizure. EXAM: CT HEAD WITHOUT CONTRAST TECHNIQUE: Contiguous axial images were obtained from the base of the skull through the vertex without intravenous contrast. COMPARISON:  None. FINDINGS: Brain: No evidence of acute infarction, hemorrhage, hydrocephalus, extra-axial collection or mass lesion/mass effect. Vascular: Mild calcific atherosclerotic disease of the intra cavernous carotid arteries. Skull: Normal. Negative for fracture or focal lesion. Sinuses/Orbits: No acute finding. Other: None. IMPRESSION: No acute intracranial abnormality.  Electronically Signed   By: Ted Mcalpine M.D.   On: 09/09/2018 23:04    EKG:   Orders placed or performed during the hospital encounter of 11/15/15  . EKG 12-Lead  . EKG 12-Lead    IMPRESSION AND PLAN:  Principal Problem:   Alcohol withdrawal seizure (HCC) -patient is currently oriented.  We will admit him with CIWA protocol and benzodiazepine for withdrawal and seizure prevention.  Chart review performed and case discussed with ED provider. Labs, imaging and/or ECG reviewed by provider and discussed with patient/family. Management plans discussed with the patient and/or family.  DVT PROPHYLAXIS: SubQ lovenox   GI PROPHYLAXIS:  None  ADMISSION STATUS: Observation  CODE STATUS: Full  TOTAL TIME TAKING CARE OF THIS PATIENT: 40 minutes.   Barney Drain 09/10/2018, 1:04 AM  [facility]  Office  [phone]  CC: Primary care physician; Patient, No Pcp Per  Note:  This document was prepared using Dragon voice recognition software and may include unintentional dictation errors.
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
## 2018-09-10 NOTE — Consult Note (Signed)
[facility] Face-to-Face Psychiatry Consult   Reason for Consult:  Alcohol withdrawal seizures Referring Physician:  Dr. Amado CoeGouru Patient Identification: Chad Parrish MRN:  [id number] Principal Diagnosis: Alcohol withdrawal seizure (HCC) Diagnosis:  Principal Problem:   Alcohol withdrawal seizure (HCC)   Total Time spent with patient: 1 hour  Subjective:  "I am tired."  HPI:  Chad Parrish is a 58 y.o. male patient admitted after presenting  to the ED after seizure.  He has a history of significant alcohol use, and states that he did not drink for the last 24 hours.   Per chart review:  Patient has been in the care of outpatient psychiatrist, Dr. Elna BreslowEappen (encounter date October 03, 2017): History of Present Illness (reviewed and updated)  Caucasian male, with significant other, un-employed, lives in [place] with S.O., has a history of alcohol use disorder, mood lability, chronic pain, abnormal liver function, presented to the clinic today to establish care.  - he struggles with irritability and anger issues.  He reports this has been ongoing since the past 6 years or so.  Patient reports it is getting worse and he felt like he needed to get help.  Patient reports he does not like people.  Patient reports that he has been losing his temper for simple reasons like losing his usual parking space and so on.  Pt reports he has never gotten violent or aggressive.  Patient denies any legal issues except for  some legal problems for drug issues 28 years ago when he was placed in jail for 2 months.  - reports appetite problems.  Patient reports he never eats.  Patient reports he has struggled with appetite  since he were a child.  He reports his mom had taken him to a pediatrician when he was younger however he did not recommend any medications at that time.  Patient reports he eats may be a small snack or a meal per day and that is all he takes.  PMD has prescribed him folic acid as well as thiamine but he is not  compliant with it.  - reports he has sleep issues when he does not drink.  Reports his doctor had prescribed him melatonin but he never took it.  - struggles with alcoholism.  He has been drinking since the age of 58 years old.  Patient reports he drinks 10-12  beer per night.  Patient reports he goes to work ,comes back home ,drinks beer and goes to bed.  Patient reports he is able to wake up every day at 6 AM in spite of drinking too much.  Patient reports he has no problem going back to work every day even though he drinks a lot.  Patient does report some withdrawal symptoms like bilateral hand tremors.  Patient however denies any serious withdrawal symptoms from his drinking.  - uses cannabis on weekends.  Patient reports that he has been doing so since the past several years.  He reports $40 worth of cannabis lasting for a month.  - denies any history of depression, anxiety, perceptual disturbances, suicidality or homicidality.  - denies any history of trauma.  - lives with his partner of 41 years.    Associated Signs/Symptoms: Depression Symptoms:  disturbed sleep, decreased appetite, mood lability (Hypo) Manic Symptoms:  Irritable Mood, Anxiety Symptoms:  denies Psychotic Symptoms:  denies PTSD Symptoms: Negative Past Psychiatric History: Patient denies ever being to any inpatient mental health hospitals before.  Patient denies any suicide attempts.  Patient denies any  previous psychiatric treatment. Previous Psychotropic Medications: Yes melatonin Substance Abuse History in the last 12 months:  Yes.  Alcohol since the age of 66. He drinks 12 beers per day .Cannabis since the past several years - 40 $ worth will last for a month. He smokes every weekend. Consequences of Substance Abuse: Medical Consequences:  AST elevated Withdrawal Symptoms:   Tremors   On evaluation, patient states that he had a seizure, and he is not sure why.  Patient denies having any past seizure.   He states he drinks 8 to 1212 ounce beers a day.  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  He states that he has been isolating himself at home during the day, but enjoys the company of his dog.  He denies having troubles falling asleep, and wakes up every day at 6 AM independent of the time he went to sleep or how much sleep he obtains.  He is able to state the television program that he watches.  Patient reports that he began drinking at the age of 79, and began drinking heavily at the age of 14.  He reports that approximately 5 years ago he was able to stop alcohol intake for approximately 10-1/2 months.  He denies ever having any withdrawal seizure.  Patient reports that on Sunday he decided he wanted to stop drinking, and on Monday morning he fell and had a seizure.  Patient reports that he has been experiencing a tremor for 3 months.  He does endorse that his mother had a history of essential/familial tremor with a head shake.  He denies head shaking himself.  Patient denies depression or mania symptoms.  He denies any hallucinations with alcohol use, or during withdrawal periods.  Patient denies any suicidal ideation, any homicidal ideation.  Patient's significant other, Chad Parrish, arrives at end of visit and expresses his concern that patient has been increasingly depressed since the loss of his job.  He reports that he has been drinking more significantly, never leaves the house, and sleeps the majority of the day.  Significant other is hopeful that [name] can receive help.  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 Patient has not maintained on this medication. Patient reports a history of anger episodes.  Risk to Self:  Denies Risk to Others:  Denies Prior Inpatient Therapy:  None Prior Outpatient  Therapy:  Yes, most recently with DrElna Breslow, in [facility].  Patient has not had any counseling.  He has not attended AA, and reports "I do not like working in groups".  Past Medical History:  Past Medical History:  Diagnosis Date  . Abnormal liver function 01/2017  . Chronic hip pain 2017    Past Surgical History:  Procedure Laterality Date  . NO PAST SURGERIES     Family History:  Family History  Problem Relation Age of Onset  . Lung cancer Mother   . Mental illness Maternal Aunt    Family Psychiatric  History: Maternal aunt had bipolar disorder and died by suicide  Social History:  Social History   Substance and Sexual Activity  Alcohol Use Yes  . Alcohol/week: 70.0 standard drinks  . Types: 70 Cans of beer per week   Comment: 10 beers a day / or more. everyday     Social History   Substance  and Sexual Activity  Drug Use Yes  . Types: Marijuana    Social History   Socioeconomic History  . Marital status: Single    Spouse name: Not on file  . Number of children: Not on file  . Years of education: Not on file  . Highest education level: Not on file  Occupational History  . Not on file  Social Needs  . Financial resource strain: Not on file  . Food insecurity:    Worry: Not on file    Inability: Not on file  . Transportation needs:    Medical: No    Non-medical: No  Tobacco Use  . Smoking status: Never Smoker  . Smokeless tobacco: Never Used  Substance and Sexual Activity  . Alcohol use: Yes    Alcohol/week: 70.0 standard drinks    Types: 70 Cans of beer per week    Comment: 10 beers a day / or more. everyday  . Drug use: Yes    Types: Marijuana  . Sexual activity: Not Currently  Lifestyle  . Physical activity:    Days per week: 0 days    Minutes per session: 0 min  . Stress: Not on file  Relationships  . Social connections:    Talks on phone: Not on file    Gets together: Not on file    Attends religious service: Never     Active member of club or organization: No    Attends meetings of clubs or organizations: Never    Relationship status: Not on file  Other Topics Concern  . Not on file  Social History Narrative  . Not on file   Additional Social History:    Patient lives with significant other, Chad Parrish of 41 years. He is unemployed since February 13, 2018.  Drinks 8 to 1212 ounce beers daily Uses marijuana most evenings, "to help me sleep."  Allergies:   Allergies  Allergen Reactions  . Opium Itching    Labs:  Results for orders placed or performed during the hospital encounter of 09/09/18 (from the past 48 hour(s))  CBC with Differential     Status: Abnormal   Collection Time: 09/09/18  9:48 PM  Result Value Ref Range   WBC 10.2 4.0 - 10.5 K/uL   RBC 4.78 4.22 - 5.81 MIL/uL   Hemoglobin 15.5 13.0 - 17.0 g/dL   HCT 10.2 72.5 - 36.6 %   MCV 95.4 80.0 - 100.0 fL   MCH 32.4 26.0 - 34.0 pg   MCHC 34.0 30.0 - 36.0 g/dL   RDW 44.0 34.7 - 42.5 %   Platelets 102 (L) 150 - 400 K/uL    Comment: SPECIMEN CHECKED FOR CLOTS Immature Platelet Fraction may be clinically indicated, consider ordering this additional test [id number]    nRBC 0.0 0.0 - 0.2 %   Neutrophils Relative % 42 %   Neutro Abs 4.4 1.7 - 7.7 K/uL   Lymphocytes Relative 41 %   Lymphs Abs 4.2 (H) 0.7 - 4.0 K/uL   Monocytes Relative 15 %   Monocytes Absolute 1.5 (H) 0.1 - 1.0 K/uL   Eosinophils Relative 0 %   Eosinophils Absolute 0.0 0.0 - 0.5 K/uL   Basophils Relative 1 %   Basophils Absolute 0.1 0.0 - 0.1 K/uL   Immature Granulocytes 1 %   Abs Immature Granulocytes 0.05 0.00 - 0.07 K/uL    Comment: Performed at [facility] [address] [place], [place] [address]  Comprehensive metabolic panel  Status: Abnormal   Collection Time: 09/09/18  9:48 PM  Result Value Ref Range   Sodium 135 135 - 145 mmol/L    Comment: REPEAT LYTES   Potassium 2.9 (L) 3.5 - 5.1 mmol/L   Chloride 95 (L) 98 - 111 mmol/L   CO2 17 (L) 22  - 32 mmol/L   Glucose, Bld 199 (H) 70 - 99 mg/dL   BUN 10 6 - 20 mg/dL   Creatinine, Ser 2.13 0.61 - 1.24 mg/dL   Calcium 08.6 8.9 - 57.8 mg/dL   Total Protein 8.1 6.5 - 8.1 g/dL   Albumin 4.9 3.5 - 5.0 g/dL   AST 469 (H) 15 - 41 U/L    Comment: RESULT CONFIRMED BY MANUAL DILUTION.PMF   ALT 186 (H) 0 - 44 U/L   Alkaline Phosphatase 102 38 - 126 U/L   Total Bilirubin 1.3 (H) 0.3 - 1.2 mg/dL   GFR calc non Af Amer >60 >60 mL/min   GFR calc Af Amer >60 >60 mL/min   Anion gap 23 (H) 5 - 15    Comment: Performed at [facility], [address] [place], [place] [address]  Urine Drug Screen, Qualitative ([facility] only)     Status: None   Collection Time: 09/09/18  9:48 PM  Result Value Ref Range   Tricyclic, Ur Screen NONE DETECTED NONE DETECTED   Amphetamines, Ur Screen NONE DETECTED NONE DETECTED   MDMA (Ecstasy)Ur Screen NONE DETECTED NONE DETECTED   Cocaine Metabolite,Ur [place] NONE DETECTED NONE DETECTED   Opiate, Ur Screen NONE DETECTED NONE DETECTED   Phencyclidine (PCP) Ur S NONE DETECTED NONE DETECTED   Cannabinoid 50 Ng, Ur [place] NONE DETECTED NONE DETECTED   Barbiturates, Ur Screen NONE DETECTED NONE DETECTED   Benzodiazepine, Ur Scrn NONE DETECTED NONE DETECTED   Methadone Scn, Ur NONE DETECTED NONE DETECTED    Comment: (NOTE) Tricyclics + metabolites, urine    Cutoff 1000 ng/mL Amphetamines + metabolites, urine  Cutoff 1000 ng/mL MDMA (Ecstasy), urine              Cutoff 500 ng/mL Cocaine Metabolite, urine          Cutoff 300 ng/mL Opiate + metabolites, urine        Cutoff 300 ng/mL Phencyclidine (PCP), urine         Cutoff 25 ng/mL Cannabinoid, urine                 Cutoff 50 ng/mL Barbiturates + metabolites, urine  Cutoff 200 ng/mL Benzodiazepine, urine              Cutoff 200 ng/mL Methadone, urine                   Cutoff 300 ng/mL The urine drug screen provides only a preliminary, unconfirmed analytical test result and should not be used for non-medical purposes.  Clinical consideration and professional judgment should be applied to any positive drug screen result due to possible interfering substances. A more specific alternate chemical method must be used in order to obtain a confirmed analytical result. Gas chromatography / mass spectrometry (GC/MS) is the preferred confirmat ory method. Performed at [facility], [address] [place], [place] [address]   Basic metabolic panel     Status: Abnormal   Collection Time: 09/10/18  3:49 AM  Result Value Ref Range   Sodium 139 135 - 145 mmol/L   Potassium 3.7 3.5 - 5.1 mmol/L   Chloride 103 98 -  111 mmol/L   CO2 27 22 - 32 mmol/L   Glucose, Bld 110 (H) 70 - 99 mg/dL   BUN 7 6 - 20 mg/dL   Creatinine, Ser 1.610.53 (L) 0.61 - 1.24 mg/dL   Calcium 9.5 8.9 - 09.610.3 mg/dL   GFR calc non Af Amer >60 >60 mL/min   GFR calc Af Amer >60 >60 mL/min   Anion gap 9 5 - 15    Comment: Performed at [facility], [address] [place], [place] [address]  CBC     Status: Abnormal   Collection Time: 09/10/18  3:49 AM  Result Value Ref Range   WBC 6.8 4.0 - 10.5 K/uL   RBC 4.57 4.22 - 5.81 MIL/uL   Hemoglobin 14.7 13.0 - 17.0 g/dL   HCT 98.142.7 19.139.0 - 47.852.0 %   MCV 93.4 80.0 - 100.0 fL   MCH 32.2 26.0 - 34.0 pg   MCHC 34.4 30.0 - 36.0 g/dL   RDW 29.514.9 62.111.5 - 30.815.5 %   Platelets 77 (L) 150 - 400 K/uL    Comment: Immature Platelet Fraction may be clinically indicated, consider ordering this additional test [id number]    nRBC 0.0 0.0 - 0.2 %    Comment: Performed at [facility], [address] [place], [place] [address]    Current Facility-Administered Medications  Medication Dose Route Frequency Provider Last Rate Last Dose  . acetaminophen (TYLENOL) tablet 650 mg  650 mg Oral Q6H PRN Oralia ManisWillis, David, MD       Or  . acetaminophen (TYLENOL) suppository 650 mg  650 mg Rectal Q6H PRN Oralia ManisWillis, David, MD      . chlordiazePOXIDE (LIBRIUM) capsule 10 mg  10 mg Oral QID Oralia ManisWillis, David, MD    10 mg at 09/10/18 1417  . chlordiazePOXIDE (LIBRIUM) capsule 25 mg  25 mg Oral Q6H PRN Mariel CraftMaurer, Ibraheem Voris M, MD      . folic acid Alvira Monday(FOLVITE) tablet 1 mg  1 mg Oral Daily Oralia ManisWillis, David, MD   1 mg at 09/10/18 1037  . hydrOXYzine (ATARAX/VISTARIL) tablet 25 mg  25 mg Oral Q6H PRN Mariel CraftMaurer, Jeweline Reif M, MD      . loperamide (IMODIUM) capsule 2-4 mg  2-4 mg Oral PRN Mariel CraftMaurer, Tarrence Enck M, MD      . LORazepam (ATIVAN) injection 0-4 mg  0-4 mg Intravenous Zara ChessQ6H Willis, David, MD   4 mg at 09/10/18 1417   Followed by  . [START ON 09/12/2018] LORazepam (ATIVAN) injection 0-4 mg  0-4 mg Intravenous Q12H Oralia ManisWillis, David, MD      . LORazepam (ATIVAN) tablet 1 mg  1 mg Oral Q6H PRN Oralia ManisWillis, David, MD       Or  . LORazepam (ATIVAN) injection 1 mg  1 mg Intravenous Q6H PRN Oralia ManisWillis, David, MD   1 mg at 09/10/18 1510  . LORazepam (ATIVAN) injection 2 mg  2 mg Intravenous Q4H PRN Oralia ManisWillis, David, MD      . multivitamin with minerals tablet 1 tablet  1 tablet Oral Daily Mariel CraftMaurer, Anely Spiewak M, MD      . ondansetron [facility](ZOFRAN) tablet 4 mg  4 mg Oral Q6H PRN Oralia ManisWillis, David, MD       Or  . ondansetron [place](ZOFRAN) injection 4 mg  4 mg Intravenous Q6H PRN Oralia ManisWillis, David, MD      . ondansetron (ZOFRAN-ODT) disintegrating tablet 4 mg  4 mg Oral Q6H PRN Mariel CraftMaurer, Theodore Virgin M, MD      . thiamine (B-1) injection 100 mg  100 mg Intravenous Daily Oralia Manis, MD      . thiamine (B-1) injection 100 mg  100 mg Intramuscular Once Mariel Craft, MD      . Melene Muller ON 09/11/2018] thiamine (VITAMIN B-1) tablet 100 mg  100 mg Oral Daily Mariel Craft, MD        Musculoskeletal: Strength & Muscle Tone: within normal limits Gait & Station: normal Patient leans: N/A  Psychiatric Specialty Exam: Physical Exam  Nursing note and vitals reviewed. Constitutional: He is oriented to person, place, and time. He appears well-developed and well-nourished. He appears distressed.  HENT:  Head: Normocephalic and atraumatic.  Eyes: EOM are normal.  Neck: Normal range of  motion.  Cardiovascular: Regular rhythm.  tachycardia  Respiratory: Effort normal. No respiratory distress.  Musculoskeletal: Normal range of motion.  Neurological: He is alert and oriented to person, place, and time.  Skin: Skin is warm and dry.    Review of Systems  Constitutional: Negative.   HENT: Negative.   Respiratory: Negative.  Negative for shortness of breath.   Cardiovascular: Negative for chest pain and palpitations.  Gastrointestinal: Positive for abdominal pain and nausea.  Musculoskeletal: Negative.   Neurological: Positive for tremors.  Psychiatric/Behavioral: Positive for substance abuse (alcohol). Negative for depression, hallucinations, memory loss and suicidal ideas. The patient is nervous/anxious. The patient does not have insomnia.     Blood pressure (!) 155/102, pulse (!) 109, temperature 99.1 F (37.3 C), temperature source Oral, resp. rate 18, height 5\' 6"  (1.676 m), weight 74.8 kg, SpO2 97 %.Body mass index is 26.63 kg/m.  General Appearance: Bizarre and Disheveled  Eye Contact:  Minimal  Speech:  Pressured  Volume:  Normal  Mood:  Anxious and Irritable  Affect:  Congruent  Thought Process:  Descriptions of Associations: Tangential  Orientation:  Full (Time, Place, and Person)  Thought Content:  Illogical, Hallucinations: None and Tangential  Suicidal Thoughts:  No  Homicidal Thoughts:  No  Memory:  fair  Judgement:  Poor  Insight:  Lacking  Psychomotor Activity:  Increased and Restlessness  Concentration:  Attention Span: Poor  Recall:  Poor  Fund of Knowledge:  Fair  Language:  Fair  Akathisia:  No  Handed:  Right  AIMS (if indicated):   n/a  Assets:  Housing Intimacy Social Support  ADL's:  Intact  Cognition:  WNL  Sleep:   pheromone     Treatment Plan Summary: Daily contact with patient to assess and evaluate symptoms and progress in treatment, Medication management and Librium taper; restart Remeron and increase to 15 mg  HS  Disposition: No evidence of imminent risk to self or others at present.   Patient does not meet criteria for psychiatric inpatient admission. Supportive therapy provided about ongoing stressors. Offered resources for substance abuse and AA, patient does not feel that he wants treatment at this time.  Mariel Craft, MD 09/10/2018 4:10 PM

## 2018-09-10 NOTE — Evaluation (Signed)
Physical Therapy Evaluation Patient Details Name: Chad Parrish MRN: [id number] DOB: 1961-05-30 Today's Date: 09/10/2018   History of Present Illness  From MD H&P: Patient presented to the ED after seizure.  He has a history of significant alcohol use, and stated that he did not drink for the last 24 hours.  He does not remember the seizure event.  Pt showed signs of withdrawal in the ED.  Hospitalist were called for admission.  MD assessment includes: Seizure seems to be from alcohol withdrawal, hypokalemia, weaness, and thrombocytopenia.      Clinical Impression  Pt presents with deficits in strength, transfers, gait, balance, and activity tolerance.  Pt was generally unsteady with all standing tasks frequently needing min A for stability.  Pt was also impulsive requiring frequent verbal and tactile cues in order to follow commands correctly.  Pt was able to amb 250 feet mostly with the use of an IV pole but also min distances without an AD and presented with significant drifting and staggering.  Pt declined use of a RW during the session but agreed based on his instability with gait that using a RW would be beneficial.  Pt will benefit from HHPT services upon discharge to safely address above deficits for decreased caregiver assistance and eventual return to PLOF.          Follow Up Recommendations Home health PT;Supervision for mobility/OOB    Equipment Recommendations  Rolling walker with 5" wheels    Recommendations for Other Services       Precautions / Restrictions Precautions Precautions: Fall Restrictions Weight Bearing Restrictions: No      Mobility  Bed Mobility Overal bed mobility: Independent                Transfers Overall transfer level: Needs assistance Equipment used: None Transfers: Sit to/from Stand Sit to Stand: Min assist         General transfer comment: Posterior instability upon initial stand requiring min A to  correct  Ambulation/Gait Ambulation/Gait assistance: Min assist Gait Distance (Feet): 250 Feet Assistive device: None;IV Pole Gait Pattern/deviations: Step-through pattern;Decreased step length - right;Decreased step length - left;Staggering left;Staggering right;Drifts right/left Gait velocity: decreased   General Gait Details: Pt declined use of a RW; Min A during amb to prevent LOB with use of an IV pole and without AD   Stairs            Wheelchair Mobility    Modified Rankin (Stroke Patients Only)       Balance Overall balance assessment: Needs assistance   Sitting balance-Leahy Scale: Fair     Standing balance support: No upper extremity supported;Single extremity supported Standing balance-Leahy Scale: Poor                               Pertinent Vitals/Pain Pain Assessment: No/denies pain    Home Living Family/patient expects to be discharged to:: Private residence Living Arrangements: Spouse/significant other(Domestic partner) Available Help at Discharge: Family;Available 24 hours/day Type of Home: House Home Access: Stairs to enter Entrance Stairs-Rails: None Entrance Stairs-Number of Steps: 1 Home Layout: One level Home Equipment: Crutches;Cane - single point      Prior Function Level of Independence: Independent         Comments: Ind amb without AD community distances, 2 falls in last 6 months per partner related to EtOH use, Ind with ADLs     Hand Dominance  Extremity/Trunk Assessment   Upper Extremity Assessment Upper Extremity Assessment: Generalized weakness(tremor to BUEs that is not typical for pt)    Lower Extremity Assessment Lower Extremity Assessment: Generalized weakness       Communication   Communication: No difficulties  Cognition Arousal/Alertness: Awake/alert Behavior During Therapy: WFL for tasks assessed/performed Overall Cognitive Status: Impaired/Different from baseline Area of  Impairment: Safety/judgement;Following commands;Problem solving                     Memory: Decreased short-term memory Following Commands: Follows one step commands inconsistently Safety/Judgement: Decreased awareness of safety   Problem Solving: Slow processing;Requires verbal cues;Requires tactile cues General Comments: Per domestic partner pt has recently been having more and more difficulty processing information and following directions; pt impulsive during the session requiring verbal and tactile cues to follow commands appropriately       General Comments      Exercises     Assessment/Plan    PT Assessment Patient needs continued PT services  PT Problem List Decreased strength;Decreased activity tolerance;Decreased balance;Decreased knowledge of use of DME;Decreased safety awareness       PT Treatment Interventions DME instruction;Stair training;Functional mobility training;Therapeutic activities;Gait training;Therapeutic exercise;Balance training;Patient/family education    PT Goals (Current goals can be found in the Care Plan section)  Acute Rehab PT Goals Patient Stated Goal: To return home and walk better PT Goal Formulation: With patient Time For Goal Achievement: 09/23/18 Potential to Achieve Goals: Good    Frequency Min 2X/week   Barriers to discharge        Co-evaluation               AM-PAC PT "6 Clicks" Mobility  Outcome Measure Help needed turning from your back to your side while in a flat bed without using bedrails?: None Help needed moving from lying on your back to sitting on the side of a flat bed without using bedrails?: None Help needed moving to and from a bed to a chair (including a wheelchair)?: A Little Help needed standing up from a chair using your arms (e.g., wheelchair or bedside chair)?: A Little Help needed to walk in hospital room?: A Little Help needed climbing 3-5 steps with a railing? : A Little 6 Click Score:  20    End of Session Equipment Utilized During Treatment: Gait belt Activity Tolerance: Patient tolerated treatment well Patient left: in chair;with call bell/phone within reach;with family/visitor present Nurse Communication: Mobility status;Other (comment)(Pt set up with chair alarm but no box present in room, nsg notified) PT Visit Diagnosis: Unsteadiness on feet (R26.81);Muscle weakness (generalized) (M62.81);Difficulty in walking, not elsewhere classified (R26.2)    Time: 1335-1400 PT Time Calculation (min) (ACUTE ONLY): 25 min   Charges:   PT Evaluation $PT Eval Low Complexity: 1 Low         D. Elly Modena PT, DPT 09/10/18, 2:31 PM

## 2018-09-11 DIAGNOSIS — R569 Unspecified convulsions: Secondary | ICD-10-CM | POA: Diagnosis present

## 2018-09-11 DIAGNOSIS — S0101XA Laceration without foreign body of scalp, initial encounter: Secondary | ICD-10-CM | POA: Diagnosis present

## 2018-09-11 DIAGNOSIS — R339 Retention of urine, unspecified: Secondary | ICD-10-CM | POA: Diagnosis not present

## 2018-09-11 DIAGNOSIS — Z885 Allergy status to narcotic agent status: Secondary | ICD-10-CM | POA: Diagnosis not present

## 2018-09-11 DIAGNOSIS — F3289 Other specified depressive episodes: Secondary | ICD-10-CM | POA: Diagnosis not present

## 2018-09-11 DIAGNOSIS — Z801 Family history of malignant neoplasm of trachea, bronchus and lung: Secondary | ICD-10-CM | POA: Diagnosis not present

## 2018-09-11 DIAGNOSIS — E876 Hypokalemia: Secondary | ICD-10-CM | POA: Diagnosis present

## 2018-09-11 DIAGNOSIS — W06XXXA Fall from bed, initial encounter: Secondary | ICD-10-CM | POA: Diagnosis present

## 2018-09-11 DIAGNOSIS — D6959 Other secondary thrombocytopenia: Secondary | ICD-10-CM | POA: Diagnosis present

## 2018-09-11 DIAGNOSIS — F10231 Alcohol dependence with withdrawal delirium: Secondary | ICD-10-CM | POA: Diagnosis present

## 2018-09-11 DIAGNOSIS — Y92013 Bedroom of single-family (private) house as the place of occurrence of the external cause: Secondary | ICD-10-CM | POA: Diagnosis not present

## 2018-09-11 DIAGNOSIS — F1290 Cannabis use, unspecified, uncomplicated: Secondary | ICD-10-CM | POA: Diagnosis present

## 2018-09-11 DIAGNOSIS — F10239 Alcohol dependence with withdrawal, unspecified: Secondary | ICD-10-CM | POA: Diagnosis not present

## 2018-09-11 DIAGNOSIS — Z791 Long term (current) use of non-steroidal anti-inflammatories (NSAID): Secondary | ICD-10-CM | POA: Diagnosis not present

## 2018-09-11 LAB — BASIC METABOLIC PANEL
Anion gap: 8 (ref 5–15)
BUN: 9 mg/dL (ref 6–20)
CO2: 25 mmol/L (ref 22–32)
Calcium: 9.1 mg/dL (ref 8.9–10.3)
Chloride: 105 mmol/L (ref 98–111)
Creatinine, Ser: 0.5 mg/dL — ABNORMAL LOW (ref 0.61–1.24)
GFR calc Af Amer: >60 mL/min (ref >60)
GFR calc non Af Amer: >60 mL/min (ref >60)
Glucose, Bld: 111 mg/dL — ABNORMAL HIGH (ref 70–99)
POTASSIUM: 3 mmol/L — AB (ref 3.5–5.1)
Sodium: 138 mmol/L (ref 135–145)

## 2018-09-11 LAB — CBC
HCT: 41.5 % (ref 39.0–52.0)
Hemoglobin: 14.2 g/dL (ref 13.0–17.0)
MCH: 32.5 pg (ref 26.0–34.0)
MCHC: 34.2 g/dL (ref 30.0–36.0)
MCV: 95 fL (ref 80.0–100.0)
Platelets: 72 10*3/uL — ABNORMAL LOW (ref 150–400)
RBC: 4.37 MIL/uL (ref 4.22–5.81)
RDW: 14.6 % (ref 11.5–15.5)
WBC: 4.7 10*3/uL (ref 4.0–10.5)
nRBC: 0 % (ref 0.0–0.2)

## 2018-09-11 LAB — MRSA PCR SCREENING: MRSA by PCR: NEGATIVE

## 2018-09-11 LAB — POTASSIUM: Potassium: 3.6 mmol/L (ref 3.5–5.1)

## 2018-09-11 LAB — MAGNESIUM: Magnesium: 2.1 mg/dL (ref 1.7–2.4)

## 2018-09-11 LAB — HIV ANTIBODY (ROUTINE TESTING W REFLEX): HIV Screen 4th Generation wRfx: NONREACTIVE

## 2018-09-11 MED ORDER — POTASSIUM CHLORIDE 10 MEQ/100ML IV SOLN
10.0000 meq | INTRAVENOUS | Status: AC
  Administered 2018-09-11 (×4): 10 meq via INTRAVENOUS
  Filled 2018-09-11 (×4): qty 100

## 2018-09-11 NOTE — Progress Notes (Signed)
Pt unable to urinate despite strong encouragement, 849 cc in bladder per scanner, indwelling catheter inserted per Dr Jayme Cloud

## 2018-09-11 NOTE — Significant Event (Signed)
Rapid Response Event Note  Overview: Time Called: 2025 Arrival Time: 2026 Event Type: Other (Comment)CIWA  Initial Focused Assessment:Pt in bed agitated and combative   Interventions: Given ativan, librium and atarax with no effect Plan of Care (if not transferred):transferrred to ICU for precedex  Event Summary: Name of Physician Notified: Willis at 2025    at    Outcome: Transferred (Comment)     Adelis Docter A

## 2018-09-11 NOTE — Progress Notes (Signed)
[facility] [place] [facility]   PATIENT NAME: Chad SpeakMark Genova    MR#:  [id number]  DATE OF BIRTH:  June 25, 1961  SUBJECTIVE:  CHIEF COMPLAINT: Patient is resting comfortably.  Transfer to ICU for Precedex drip   REVIEW OF SYSTEMS:  Review of system unobtainable as the patient is lethargic  DRUG ALLERGIES:   Allergies  Allergen Reactions  . Opium Itching    VITALS:  Blood pressure (!) 138/95, pulse 60, temperature 97.8 F (36.6 C), temperature source Oral, resp. rate 16, height 5\' 6"  (1.676 m), weight 72.8 kg, SpO2 91 %.  PHYSICAL EXAMINATION:  GENERAL:  58 y.o.-year-old patient lying in the bed with no acute distress.  EYES: Pupils equal, round, reactive to light and accommodation. No scleral icterus. Extraocular muscles intact.  HEENT: Head atraumatic, normocephalic. Oropharynx and nasopharynx clear.  NECK:  Supple, no jugular venous distention. No thyroid enlargement, no tenderness.  LUNGS: Normal breath sounds bilaterally, no wheezing, rales,rhonchi or crepitation. No use of accessory muscles of respiration.  CARDIOVASCULAR: S1, S2 normal. No murmurs, rubs, or gallops.  ABDOMEN: Soft, nontender, nondistended. Bowel sounds present.   EXTREMITIES: No pedal edema, cyanosis, or clubbing.  NEUROLOGIC: Altered  pSYCHIATRIC: The patient is on Precedex SKIN: No obvious rash, lesion, or ulcer.    LABORATORY PANEL:   CBC Recent Labs  Lab 09/11/18 0502  WBC 4.7  HGB 14.2  HCT 41.5  PLT 72*   ------------------------------------------------------------------------------------------------------------------  Chemistries  Recent Labs  Lab 09/09/18 2148  09/11/18 0502 09/11/18 1244  NA 135   < > 138  --   K 2.9*   < > 3.0* 3.6  CL 95*   < > 105  --   CO2 17*   < > 25  --   GLUCOSE 199*   < > 111*  --   BUN 10   < > 9  --   CREATININE 0.70   < > 0.50*  --   CALCIUM 10.2   < > 9.1  --   MG  --   --  2.1  --   AST 296*  --   --   --   ALT  186*  --   --   --   ALKPHOS 102  --   --   --   BILITOT 1.3*  --   --   --    < > = values in this interval not displayed.   ------------------------------------------------------------------------------------------------------------------  Cardiac Enzymes No results for input(s): TROPONINI in the last 168 hours. ------------------------------------------------------------------------------------------------------------------  RADIOLOGY:  Ct Head Wo Contrast  Result Date: 09/10/2018 CLINICAL DATA:  Seizure.  Fall with reported head injury. EXAM: CT HEAD WITHOUT CONTRAST TECHNIQUE: Contiguous axial images were obtained from the base of the skull through the vertex without intravenous contrast. COMPARISON:  CT 09/09/2018 FINDINGS: Brain: There is no evidence of acute intracranial hemorrhage, mass lesion, brain edema or extra-axial fluid collection. The ventricles and subarachnoid spaces are appropriately sized for age. There is no CT evidence of acute cortical infarction. Vascular:  No hyperdense vessel identified. Skull: Negative for fracture or focal lesion. Sinuses/Orbits: The visualized paranasal sinuses and mastoid air cells are clear. No orbital abnormalities are seen. Other: None. IMPRESSION: Stable normal noncontrast head CT. Electronically Signed   By: Carey BullocksWilliam  Veazey M.D.   On: 09/10/2018 16:01   Ct Head Wo Contrast  Result Date: 09/09/2018 CLINICAL DATA:  Seizure. EXAM: CT HEAD WITHOUT CONTRAST TECHNIQUE: Contiguous axial images were obtained  from the base of the skull through the vertex without intravenous contrast. COMPARISON:  None. FINDINGS: Brain: No evidence of acute infarction, hemorrhage, hydrocephalus, extra-axial collection or mass lesion/mass effect. Vascular: Mild calcific atherosclerotic disease of the intra cavernous carotid arteries. Skull: Normal. Negative for fracture or focal lesion. Sinuses/Orbits: No acute finding. Other: None. IMPRESSION: No acute intracranial  abnormality. Electronically Signed   By: Ted Mcalpineobrinka  Dimitrova M.D.   On: 09/09/2018 23:04    EKG:   Orders placed or performed during the hospital encounter of 11/15/15  . EKG 12-Lead  . EKG 12-Lead    ASSESSMENT AND PLAN:    #Seizure seems to be from alcohol withdrawal CIWA protocol Precedex drip Benzodiazepine for withdrawal Neurology and psychiatry consult placed CT head negative  #Hypokalemia Resolved and potassium is at 3.7 Check magnesium level  #Generalized weakness Patient is on multivitamin thiamine and folate PT consult placed  #Thrombocytopenia-probably from alcohol abuse Avoid antiplatelet agents    All the records are reviewed and case discussed with Care Management/Social Workerr. Management plans discussed with the patient, family and they are in agreement.  CODE STATUS: fc   TOTAL TIME TAKING CARE OF THIS PATIENT: 35  minutes.   POSSIBLE D/C IN 1 DAYS, DEPENDING ON CLINICAL CONDITION.  Note: This dictation was prepared with Dragon dictation along with smaller phrase technology. Any transcriptional errors that result from this process are unintentional.   Ramonita LabAruna Brodin Gelpi M.D on 09/11/2018 at 2:45 PM  Between 7am to 6pm - Pager - [phone] After 6pm go to [email or web address] - password [email or web address]  [facility]  Office  [phone]  CC: Primary care physician; Patient, No Pcp Per

## 2018-09-11 NOTE — Consult Note (Signed)
Name: Chad Parrish MRN: [id number] DOB: [birth year]    ADMISSION DATE:  09/09/2018 CONSULTATION DATE:  09/10/2018  REFERRING MD :  Dr. Amado CoeGouru  CHIEF COMPLAINT:  Delirium Tremens  BRIEF PATIENT DESCRIPTION:  58 y.o. Male [facility] on 09/10/18 due to Alcohol Withdrawal Seizures.  Pt reported that he had not drank in the last 24 hours, normally drinks 8 to 10 beers a day.  Late in the evening 09/10/18, pt with tremors, diaphoresis, and agitation concerning for severe DT's [facility].  SIGNIFICANT EVENTS  09/10/18>> [facility] 09/10/18>> Severe DT's requiring transfer to Stepdown for Precedex infusion  STUDIES:  CT Head 09/09/18>> No acute intracranial abnormality. CT Head 09/10/18>> Stable normal noncontrast head CT.  CULTURES: MRSA PCR 09/10/18>>  ANTIBIOTICS:  HISTORY OF PRESENT ILLNESS:   Chad Parrish is a 58 year old male with a past medical history of Alcohol abuse, chronic hip pain,  and abnormal liver function who [facility] for witnessed seizure-like activity.  Patient's partner reported that they heard a loud thud, and when they went to check on him, found him in the floor seizing.  The patient reported that he had been feeling unwell for approximately 1 day and had not been drinking any alcohol since that time.  Patient reports daily alcohol consumption of 8-9 beers.  Patient did fall on the floor during seizure with subsequent small laceration to the back of his head.  Upon arrival to the ED he was alert and oriented and hemodynamically stable.  Initial CT of the head was negative for acute intracranial abnormality.  [facility] for treatment of suspected alcohol withdrawal seizures.  Later in the evening on 2/11 patient noted to have vigorous tremors, diaphoresis, and agitation concerning for severe delirium tremens.  [facility].  PCCM is consulted for further management.  PAST MEDICAL HISTORY :   has a past medical history of Abnormal liver function (01/2017) and Chronic hip pain (2017).  has a past surgical history that includes No past surgeries. Prior to Admission medications   Medication Sig Start Date End Date Taking? Authorizing Provider  gabapentin (NEURONTIN) 100 MG capsule Take 1 capsule (100 mg total) by mouth 2 (two) times daily. Patient not taking: Reported on 09/10/2018 10/03/17   Jomarie LongsEappen, Saramma, MD  mirtazapine (REMERON) 15 MG tablet Take 0.5 tablets (7.5 mg total) by mouth at bedtime. Patient not taking: Reported on 09/10/2018 10/03/17   Jomarie LongsEappen, Saramma, MD   Allergies  Allergen Reactions  . Opium Itching    FAMILY HISTORY:  family history includes Lung cancer in his mother; Mental illness in his maternal aunt. SOCIAL HISTORY:  reports that he has never smoked. He has never used smokeless tobacco. He reports current alcohol use of about 70.0 standard drinks of alcohol per week. He reports current drug use. Drug: Marijuana.  REVIEW OF SYSTEMS:   Unable to obtain due to AMS and sedation with precedex  SUBJECTIVE:  Unable to obtain due to AMS and sedation with precedex  VITAL SIGNS: Temp:  [97.5 F (36.4 C)-99.2 F (37.3 C)] 98.9 F (37.2 C) (02/11 2258) Pulse Rate:  [87-145] 87 (02/11 2300) Resp:  [15-27] 27 (02/11 2300) BP: (115-158)/(74-104) 156/92 (02/11 2300) SpO2:  [86 %-97 %] 96 % (02/11 2300) Weight:  [72.8 kg] 72.8 kg (02/11 2258)  PHYSICAL EXAMINATION:  General:  Acutely ill appearing male, laying in bed asleep, sedated with precedex, in NAD Neuro:  Sedated with precedex, withdraws from precedex, Pupils PERRL HEENT:  Atraumatic, Normocephalic, neck supple, no JVD Cardiovascular:  RRR, s1s2, no M/R/G, 2+ pulses throughout Lungs:  Clear to auscultation bilaterally, even, nonlabored, normal effort Abdomen:  Soft,  nontender, nondistended, no guarding or rebound tenderness, BS+ x4 Musculoskeletal:  Normal bulk and tone, no deformities, no edema Skin:  Warm/dry.  No obvious rashes, lesions, or ulcerations  Recent Labs  Lab 09/09/18 2148 09/10/18 0349  NA 135 139  K 2.9* 3.7  CL 95* 103  CO2 17* 27  BUN 10 7  CREATININE 0.70 0.53*  GLUCOSE 199* 110*   Recent Labs  Lab 09/09/18 2148 09/10/18 0349  HGB 15.5 14.7  HCT 45.6 42.7  WBC 10.2 6.8  PLT 102* 77*   Ct Head Wo Contrast  Result Date: 09/10/2018 CLINICAL DATA:  Seizure.  Fall with reported head injury. EXAM: CT HEAD WITHOUT CONTRAST TECHNIQUE: Contiguous axial images were obtained from the base of the skull through the vertex without intravenous contrast. COMPARISON:  CT 09/09/2018 FINDINGS: Brain: There is no evidence of acute intracranial hemorrhage, mass lesion, brain edema or extra-axial fluid collection. The ventricles and subarachnoid spaces are appropriately sized for age. There is no CT evidence of acute cortical infarction. Vascular:  No hyperdense vessel identified. Skull: Negative for fracture or focal lesion. Sinuses/Orbits: The visualized paranasal sinuses and mastoid air cells are clear. No orbital abnormalities are seen. Other: None. IMPRESSION: Stable normal noncontrast head CT. Electronically Signed   By: Carey Bullocks M.D.   On: 09/10/2018 16:01   Ct Head Wo Contrast  Result Date: 09/09/2018 CLINICAL DATA:  Seizure. EXAM: CT HEAD WITHOUT CONTRAST TECHNIQUE: Contiguous axial images were obtained from the base of the skull through the vertex without intravenous contrast. COMPARISON:  None. FINDINGS: Brain: No evidence of acute infarction, hemorrhage, hydrocephalus, extra-axial collection or mass lesion/mass effect. Vascular: Mild calcific atherosclerotic disease of the intra cavernous carotid arteries. Skull: Normal. Negative for fracture or focal lesion. Sinuses/Orbits: No acute finding. Other: None. IMPRESSION: No acute  intracranial abnormality. Electronically Signed   By: Ted Mcalpine M.D.   On: 09/09/2018 23:04    ASSESSMENT / PLAN:  ETOH Withdrawal Seizure -Neurology following, appreciate input; Neuro does not recommend further seizure workup or seizure medications since seizure precipitated by ETOH withdrawal -CT Head negative on 2/10 & 2/11 -Seizure precautions -Continue Folate and Thiamine replacement  Delirium Tremens -Cardiac monitoring -CIWA protocol, prn Ativan -Precedex drip -Aspiration precautions  Thrombocytopenia, likely in setting of ETOH abuse -Monitor for S/Sx of bleeding -Trend CBC -SCD's for VTE Prophylaxis  -Transfuse for Hgb <7 -Transfuse Platelets for Platelet count <50 and active bleeding -Avoid antiplatelets   DISPOSITION: Stepdown GOALS OF CARE: Full Code VTE PROPHYLAXIS: SCD's UPDATES: No family at bedside for update during NP rounds 09/11/18.  Harlon Ditty, AGACNP-BC [facility] Pager: [phone] Cell: [phone]  09/11/2018, 12:24 AM

## 2018-09-11 NOTE — Progress Notes (Signed)
PT Cancellation Note  Patient Details Name: Chad Parrish MRN: [id number] DOB: September 09, 1960   Cancelled Treatment:    Reason Eval/Treat Not Completed: Medical issues which prohibited therapy.  Pt transferred to a higher level of care.  Will complete PT orders at this time but will reassess pt as appropriate upon receipt of new PT orders.    Ovidio Hanger PT, DPT 09/11/18, 12:47 PM

## 2018-09-11 NOTE — Progress Notes (Signed)
Pt awakened while SO in room talking to him.  Mildly agitated and trying to remove EKG leads.  Calmed him verbally; he does not appears to remember much from the past 24 hours.    Partner upset b/c patient's cell phone is not in the room.  He states pt had his cell phone in room 125 yesterday and was using it.  Writer called [facility] but they do not have the phone.  I also looked in the patient's belongings and found his wallet and cell phone charger, but not the phone itself.  His partner took wallet home.

## 2018-09-11 NOTE — Progress Notes (Signed)
Notified Dr. Anne HahnWillis at approx 2000 of CIWA score elevation and pnt's level of agitation/combative behavior. Called both a rapid response and security to assist in the room as pnt was trying to get out of bed and completely delirious. Gave 4 mg of Ativan, per order. Educated pnt but at this time interventions were ineffctive.  Notified Dr. Anne HahnWillis again at approximately 2150 of increasing CIWA score and increased agitation although Ativan PRN dose and increased scheduled dose of librium given. No changes to level of agitation  Dr Anne HahnWillis put in order to transfer to ICU for closer monitoring and potential for precedex gtt.  Gave report to [facility] in ICU at approx 2230 and transported pnt with security and nursing staff.

## 2018-09-11 NOTE — Progress Notes (Signed)
Chad Parrish(partner) called in to check on pnt. Informed partner of transfer to ICU due to pnts severity of tremors, agitation, and heart rate fluctuations with the increased anxiety. Explained we had a sitter with him continuously and needed to call security twice to prevent pnt from trying to leave.   Pnts partner was very happy to hear that he was transferred. Gave him the name of the nurse in ICU that I gave report to which was Renee. Transferred call to ICU.

## 2018-09-11 NOTE — Consult Note (Signed)
[facility]   Reason for Consult:  Alcohol withdrawal seizures Referring Physician:  Dr. Amado CoeGouru Patient Identification: Chad Parrish Mourer MRN:  [id number] Principal Diagnosis: Alcohol withdrawal seizure [facility](HCC) Diagnosis:  Principal Problem:   Alcohol withdrawal seizure (HCC)   Total Time spent with patient: 15 minutes  Subjective: Patient asleep, sedated due to Precedex.  HPI:  Chad Parrish Hoots is a 58 y.o. male patient admitted after presenting  to the ED after seizure.  He has a history of significant alcohol use, and states that he did not drink for the last 24 hours.   On evaluation today, patient is sedated.  Sitter is at bedside, who reports that earlier in the day patient awakened easily, and was alert and oriented.  He has since been sleeping, and was unaware of his significant other visiting as well.  Per chart review:  Patient has been in the care of outpatient psychiatrist, Dr. Elna BreslowEappen (encounter date October 03, 2017): History of Present Illness (reviewed and updated)  Caucasian male, with significant other, un-employed, lives in [place] with S.O., has a history of alcohol use disorder, mood lability, chronic pain, abnormal liver function, presented to the clinic today to establish care.  - he struggles with irritability and anger issues.  He reports this has been ongoing since the past 6 years or so.  Patient reports it is getting worse and he felt like he needed to get help.  Patient reports he does not like people.  Patient reports that he has been losing his temper for simple reasons like losing his usual parking space and so on.  Pt reports he has never gotten violent or aggressive.  Patient denies any legal issues except for  some legal problems for drug issues 28 years ago when he was placed in jail for 2 months.  - reports appetite problems.  Patient reports he never eats.  Patient reports he has struggled with appetite  since he were a child.  He reports his mom  had taken him to a pediatrician when he was younger however he did not recommend any medications at that time.  Patient reports he eats may be a small snack or a meal per day and that is all he takes.  PMD has prescribed him folic acid as well as thiamine but he is not compliant with it.  - reports he has sleep issues when he does not drink.  Reports his doctor had prescribed him melatonin but he never took it.  - struggles with alcoholism.  He has been drinking since the age of 58 years old.  Patient reports he drinks 10-12  beer per night.  Patient reports he goes to work ,comes back home ,drinks beer and goes to bed.  Patient reports he is able to wake up every day at 6 AM in spite of drinking too much.  Patient reports he has no problem going back to work every day even though he drinks a lot.  Patient does report some withdrawal symptoms like bilateral hand tremors.  Patient however denies any serious withdrawal symptoms from his drinking.  - uses cannabis on weekends.  Patient reports that he has been doing so since the past several years.  He reports $40 worth of cannabis lasting for a month.  - denies any history of depression, anxiety, perceptual disturbances, suicidality or homicidality.  - denies any history of trauma.  - lives with his partner of 41 years.    Associated Signs/Symptoms: Depression Symptoms:  disturbed sleep,  decreased appetite, mood lability (Hypo) Manic Symptoms:  Irritable Mood, Anxiety Symptoms:  denies Psychotic Symptoms:  denies PTSD Symptoms: Negative Past Psychiatric History: Patient denies ever being to any inpatient mental health hospitals before.  Patient denies any suicide attempts.  Patient denies any previous psychiatric treatment. Previous Psychotropic Medications: Yes melatonin Substance Abuse History in the last 12 months:  Yes.  Alcohol since the age of 48. He drinks 12 beers per day .Cannabis since the past several years - 40 $ worth will  last for a month. He smokes every weekend. Consequences of Substance Abuse: Medical Consequences:  AST elevated Withdrawal Symptoms:   Tremors   On intake evaluation: Patient states that he had a seizure, and he is not sure why.  Patient denies having any past seizure.  He states he drinks 8 to 1212 ounce beers a day.  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  He states that he has been isolating himself at home during the day, but enjoys the company of his dog.  He denies having troubles falling asleep, and wakes up every day at 6 AM independent of the time he went to sleep or how much sleep he obtains.  He is able to state the television program that he watches.  Patient reports that he began drinking at the age of 74, and began drinking heavily at the age of 54.  He reports that approximately 5 years ago he was able to stop alcohol intake for approximately 10-1/2 months.  He denies ever having any withdrawal seizure.  Patient reports that on Sunday he decided he wanted to stop drinking, and on Monday morning he fell and had a seizure.  Patient reports that he has been experiencing a tremor for 3 months.  He does endorse that his mother had a history of essential/familial tremor with a head shake.  He denies head shaking himself.  Patient denies depression or mania symptoms.  He denies any hallucinations with alcohol use, or during withdrawal periods.  Patient denies any suicidal ideation, any homicidal ideation.  Patient's significant other, Homero Fellers, arrives at end of visit and expresses his concern that patient has been increasingly depressed since the loss of his job.  He reports that he has been drinking more significantly, never leaves the house, and sleeps the majority of the day.  Significant other is hopeful that Treylon can receive help.  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 Patient has not maintained on this medication. Patient reports a history of anger episodes.  Risk to Self:  Denies Risk to Others:  Denies Prior Inpatient Therapy:  None Prior Outpatient Therapy:  Yes, most recently with Dr[facility]na [facility]reslow, [facility].  Patient has not had any counseling.  He has not attended AA, and reports "I do not like working in groups".  Past Medical History:  Past Medical History:  Diagnosis Date  . Abnormal liver function 01/2017  . Chronic hip pain 2017    Past Surgical History:  Procedure Laterality Date  . NO PAST SURGERIES     Family History:  Family History  Problem Relation Age of Onset  . Lung cancer Mother   . Mental illness Maternal Aunt    Family Psychiatric  History: Maternal aunt had bipolar disorder and died by suicide  Social History:  Social  History   Substance and Sexual Activity  Alcohol Use Yes  . Alcohol/week: 70.0 standard drinks  . Types: 70 Cans of beer per week   Comment: 10 beers a day / or more. everyday     Social History   Substance and Sexual Activity  Drug Use Yes  . Types: Marijuana    Social History   Socioeconomic History  . Marital status: Single    Spouse name: Not on file  . Number of children: Not on file  . Years of education: Not on file  . Highest education level: Not on file  Occupational History  . Not on file  Social Needs  . Financial resource strain: Not on file  . Food insecurity:    Worry: Not on file    Inability: Not on file  . Transportation needs:    Medical: No    Non-medical: No  Tobacco Use  . Smoking status: Never Smoker  . Smokeless tobacco: Never Used  Substance and Sexual Activity  . Alcohol use: Yes    Alcohol/week: 70.0 standard drinks    Types: 70 Cans of beer per week    Comment: 10 beers a day / or more. everyday  . Drug use: Yes    Types: Marijuana  . Sexual activity: Not  Currently  Lifestyle  . Physical activity:    Days per week: 0 days    Minutes per session: 0 min  . Stress: Not on file  Relationships  . Social connections:    Talks on phone: Not on file    Gets together: Not on file    Attends religious service: Never    Active member of club or organization: No    Attends meetings of clubs or organizations: Never    Relationship status: Not on file  Other Topics Concern  . Not on file  Social History Narrative  . Not on file   Additional Social History:    Patient lives with significant other, Homero FellersFrank of 41 years. He is unemployed since February 13, 2018.  Drinks 8 to 1212 ounce beers daily Uses marijuana most evenings, "to help me sleep."  Allergies:   Allergies  Allergen Reactions  . Opium Itching    Labs:  Results for orders placed or performed during the hospital encounter of 09/09/18 (from the past 48 hour(s))  CBC with Differential     Status: Abnormal   Collection Time: 09/09/18  9:48 PM  Result Value Ref Range   WBC 10.2 4.0 - 10.5 K/uL   RBC 4.78 4.22 - 5.81 MIL/uL   Hemoglobin 15.5 13.0 - 17.0 g/dL   HCT 96.045.6 45.439.0 - 09.852.0 %   MCV 95.4 80.0 - 100.0 fL   MCH 32.4 26.0 - 34.0 pg   MCHC 34.0 30.0 - 36.0 g/dL   RDW 11.914.9 14.711.5 - 82.915.5 %   Platelets 102 (L) 150 - 400 K/uL    Comment: SPECIMEN CHECKED FOR CLOTS Immature Platelet Fraction may be clinically indicated, consider ordering this additional test [id number]    nRBC 0.0 0.0 - 0.2 %   Neutrophils Relative % 42 %   Neutro Abs 4.4 1.7 - 7.7 K/uL   Lymphocytes Relative 41 %   Lymphs Abs 4.2 (H) 0.7 - 4.0 K/uL   Monocytes Relative 15 %   Monocytes Absolute 1.5 (H) 0.1 - 1.0 K/uL   Eosinophils Relative 0 %   Eosinophils Absolute 0.0 0.0 - 0.5 K/uL   Basophils Relative 1 %  Basophils Absolute 0.1 0.0 - 0.1 K/uL   Immature Granulocytes 1 %   Abs Immature Granulocytes 0.05 0.00 - 0.07 K/uL    Comment: Performed at [facility], [address] [place],  [place] [address]  Comprehensive metabolic panel     Status: Abnormal   Collection Time: 09/09/18  9:48 PM  Result Value Ref Range   Sodium 135 135 - 145 mmol/L    Comment: REPEAT LYTES   Potassium 2.9 (L) 3.5 - 5.1 mmol/L   Chloride 95 (L) 98 - 111 mmol/L   CO2 17 (L) 22 - 32 mmol/L   Glucose, Bld 199 (H) 70 - 99 mg/dL   BUN 10 6 - 20 mg/dL   Creatinine, Ser 4.98 0.61 - 1.24 mg/dL   Calcium 26.4 8.9 - 15.8 mg/dL   Total Protein 8.1 6.5 - 8.1 g/dL   Albumin 4.9 3.5 - 5.0 g/dL   AST 309 (H) 15 - 41 U/L    Comment: RESULT CONFIRMED BY MANUAL DILUTION.PMF   ALT 186 (H) 0 - 44 U/L   Alkaline Phosphatase 102 38 - 126 U/L   Total Bilirubin 1.3 (H) 0.3 - 1.2 mg/dL   GFR calc non Af Amer >60 >60 mL/min   GFR calc Af Amer >60 >60 mL/min   Anion gap 23 (H) 5 - 15    Comment: Performed at [facility], [address], [place], [place] [address]  Urine Drug Screen, Qualitative ([facility] only)     Status: None   Collection Time: 09/09/18  9:48 PM  Result Value Ref Range   Tricyclic, Ur Screen NONE DETECTED NONE DETECTED   Amphetamines, Ur Screen NONE DETECTED NONE DETECTED   MDMA (Ecstasy)Ur Screen NONE DETECTED NONE DETECTED   Cocaine Metabolite,Ur [place] NONE DETECTED NONE DETECTED   Opiate, Ur Screen NONE DETECTED NONE DETECTED   Phencyclidine (PCP) Ur S NONE DETECTED NONE DETECTED   Cannabinoid 50 Ng, Ur [place] NONE DETECTED NONE DETECTED   Barbiturates, Ur Screen NONE DETECTED NONE DETECTED   Benzodiazepine, Ur Scrn NONE DETECTED NONE DETECTED   Methadone Scn, Ur NONE DETECTED NONE DETECTED    Comment: (NOTE) Tricyclics + metabolites, urine    Cutoff 1000 ng/mL Amphetamines + metabolites, urine  Cutoff 1000 ng/mL MDMA (Ecstasy), urine              Cutoff 500 ng/mL Cocaine Metabolite, urine          Cutoff 300 ng/mL Opiate + metabolites, urine        Cutoff 300 ng/mL Phencyclidine (PCP), urine         Cutoff 25 ng/mL Cannabinoid, urine                 Cutoff 50 ng/mL Barbiturates +  metabolites, urine  Cutoff 200 ng/mL Benzodiazepine, urine              Cutoff 200 ng/mL Methadone, urine                   Cutoff 300 ng/mL The urine drug screen provides only a preliminary, unconfirmed analytical test result and should not be used for non-medical purposes. Clinical consideration and professional judgment should be applied to any positive drug screen result due to possible interfering substances. A more specific alternate chemical method must be used in order to obtain a confirmed analytical result. Gas chromatography / mass spectrometry (GC/MS) is the preferred confirmat ory method. Performed at [facility], [address], [place], [place]  [address]   HIV antibody (Routine Testing)     Status: None   Collection Time: 09/10/18  3:49 AM  Result Value Ref Range   HIV Screen 4th Generation wRfx Non Reactive Non Reactive    Comment: (NOTE) Performed At: [facility] [address] [place], [place] [id number] Jolene Schimke MD IW:[id number]   Basic metabolic panel     Status: Abnormal   Collection Time: 09/10/18  3:49 AM  Result Value Ref Range   Sodium 139 135 - 145 mmol/L   Potassium 3.7 3.5 - 5.1 mmol/L   Chloride 103 98 - 111 mmol/L   CO2 27 22 - 32 mmol/L   Glucose, Bld 110 (H) 70 - 99 mg/dL   BUN 7 6 - 20 mg/dL   Creatinine, Ser 5.05 (L) 0.61 - 1.24 mg/dL   Calcium 9.5 8.9 - 39.7 mg/dL   GFR calc non Af Amer >60 >60 mL/min   GFR calc Af Amer >60 >60 mL/min   Anion gap 9 5 - 15    Comment: Performed at [facility], [address] [place], [place] [address]  CBC     Status: Abnormal   Collection Time: 09/10/18  3:49 AM  Result Value Ref Range   WBC 6.8 4.0 - 10.5 K/uL   RBC 4.57 4.22 - 5.81 MIL/uL   Hemoglobin 14.7 13.0 - 17.0 g/dL   HCT 93.7 90.2 - 40.9 %   MCV 93.4 80.0 - 100.0 fL   MCH 32.2 26.0 - 34.0 pg   MCHC 34.4 30.0 - 36.0 g/dL   RDW 73.5 32.9 - 92.4 %   Platelets 77 (L) 150 - 400 K/uL    Comment: Immature  Platelet Fraction may be clinically indicated, consider ordering this additional test [id number]    nRBC 0.0 0.0 - 0.2 %    Comment: Performed at [facility], [address] [place], [place] [address]  Glucose, capillary     Status: Abnormal   Collection Time: 09/10/18 11:16 PM  Result Value Ref Range   Glucose-Capillary 114 (H) 70 - 99 mg/dL  MRSA PCR Screening     Status: None   Collection Time: 09/10/18 11:19 PM  Result Value Ref Range   MRSA by PCR NEGATIVE NEGATIVE    Comment:        The GeneXpert MRSA Assay (FDA approved for NASAL specimens only), is one component of a comprehensive MRSA colonization surveillance program. It is not intended to diagnose MRSA infection nor to guide or monitor treatment for MRSA infections. Performed at [facility], [address] [place], [place] [address]   Magnesium     Status: None   Collection Time: 09/11/18  5:02 AM  Result Value Ref Range   Magnesium 2.1 1.7 - 2.4 mg/dL    Comment: Performed at [facility], [address] [place], [place] [address]  Basic metabolic panel     Status: Abnormal   Collection Time: 09/11/18  5:02 AM  Result Value Ref Range   Sodium 138 135 - 145 mmol/L   Potassium 3.0 (L) 3.5 - 5.1 mmol/L   Chloride 105 98 - 111 mmol/L   CO2 25 22 - 32 mmol/L   Glucose, Bld 111 (H) 70 - 99 mg/dL   BUN 9 6 - 20 mg/dL   Creatinine, Ser 4.08 (L) 0.61 - 1.24 mg/dL   Calcium 9.1 8.9 - 14.4 mg/dL   GFR calc non Af Amer >60 >60 mL/min   GFR calc  Af Amer >60 >60 mL/min   Anion gap 8 5 - 15    Comment: Performed at [facility], [address] [place], [place] [address]  CBC     Status: Abnormal   Collection Time: 09/11/18  5:02 AM  Result Value Ref Range   WBC 4.7 4.0 - 10.5 K/uL   RBC 4.37 4.22 - 5.81 MIL/uL   Hemoglobin 14.2 13.0 - 17.0 g/dL   HCT 60.4 54.0 - 98.1 %   MCV 95.0 80.0 - 100.0 fL   MCH 32.5 26.0 - 34.0 pg   MCHC 34.2 30.0 - 36.0 g/dL   RDW 19.1 47.8 -  29.5 %   Platelets 72 (L) 150 - 400 K/uL    Comment: SPECIMEN CHECKED FOR CLOTS Immature Platelet Fraction may be clinically indicated, consider ordering this additional test [id number] CONSISTENT WITH PREVIOUS RESULT    nRBC 0.0 0.0 - 0.2 %    Comment: Performed at [facility], [address] [place], [place] [address]  Potassium     Status: None   Collection Time: 09/11/18 12:44 PM  Result Value Ref Range   Potassium 3.6 3.5 - 5.1 mmol/L    Comment: Performed at [facility], [address], [place], [place] [address]    Current Facility-Administered Medications  Medication Dose Route Frequency Provider Last Rate Last Dose  . acetaminophen (TYLENOL) tablet 650 mg  650 mg Oral Q6H PRN Oralia Manis, MD       Or  . acetaminophen (TYLENOL) suppository 650 mg  650 mg Rectal Q6H PRN Oralia Manis, MD      . chlordiazePOXIDE (LIBRIUM) capsule 25 mg  25 mg Oral QID Oralia Manis, MD   25 mg at 09/10/18 2122  . dexmedetomidine (PRECEDEX) 200 MCG/50ML (4 mcg/mL) infusion  0.2-0.7 mcg/kg/hr Intravenous Continuous Oralia Manis, MD 11.22 mL/hr at 09/11/18 1200 0.6 mcg/kg/hr at 09/11/18 1200  . folic acid (FOLVITE) tablet 1 mg  1 mg Oral Daily Oralia Manis, MD   1 mg at 09/10/18 1037  . hydrOXYzine (ATARAX/VISTARIL) tablet 25 mg  25 mg Oral Q6H PRN Mariel Craft, MD   25 mg at 09/10/18 2045  . loperamide (IMODIUM) capsule 2-4 mg  2-4 mg Oral PRN Mariel Craft, MD      . LORazepam (ATIVAN) injection 0-4 mg  0-4 mg Intravenous Zara Chess, MD   2 mg at 09/11/18 1225   Followed by  . [START ON 09/12/2018] LORazepam (ATIVAN) injection 0-4 mg  0-4 mg Intravenous Rigoberto Noel, MD      . LORazepam (ATIVAN) tablet 1 mg  1 mg Oral Q6H PRN Oralia Manis, MD   1 mg at 09/10/18 1808   Or  . LORazepam (ATIVAN) injection 1 mg  1 mg Intravenous Q6H PRN Oralia Manis, MD   1 mg at 09/10/18 1510  . LORazepam (ATIVAN) injection 2 mg  2 mg Intravenous Q4H PRN Oralia Manis,  MD      . multivitamin with minerals tablet 1 tablet  1 tablet Oral Daily Mariel Craft, MD      . ondansetron [facility]) tablet 4 mg  4 mg Oral Q6H PRN Oralia Manis, MD       Or  . ondansetron [facility]) injection 4 mg  4 mg Intravenous Q6H PRN Oralia Manis, MD      . ondansetron (ZOFRAN-ODT) disintegrating tablet 4 mg  4 mg Oral Q6H PRN Mariel Craft, MD      . thiamine (  B-1) injection 100 mg  100 mg Intravenous Daily Oralia Manis, MD   100 mg at 09/11/18 0807  . thiamine (VITAMIN B-1) tablet 100 mg  100 mg Oral Daily Mariel Craft, MD       Musculoskeletal: Strength & Muscle Tone: within normal limits Gait & Station: normal Patient leans: N/A  Psychiatric Specialty Exam: Physical Exam  Nursing note and vitals reviewed. Constitutional: He is oriented to person, place, and time. He appears well-developed and well-nourished. He appears distressed.  HENT:  Head: Normocephalic and atraumatic.  Cardiovascular: Normal rate and regular rhythm.  tachycardia  Respiratory: Effort normal. No respiratory distress.  Neurological: He is oriented to person, place, and time.  Skin: Skin is warm and dry.    Review of Systems  Unable to perform ROS: Acuity of condition  Psychiatric/Behavioral: Positive for substance abuse (alcohol).    Blood pressure (!) 138/95, pulse 60, temperature 97.8 F (36.6 C), temperature source Oral, resp. rate 16, height 5\' 6"  (1.676 m), weight 72.8 kg, SpO2 91 %.Body mass index is 25.9 kg/m.  General Appearance: Neat  Eye Contact:  None  Speech:  NA  Volume:  na  Mood:  sedated  Affect:  Congruent  Thought Process:  NA  Orientation:  Full (Time, Place, and Person) per sitter report  Thought Content:  NA  Suicidal Thoughts:  No  Homicidal Thoughts:  No  Memory:  NA  Judgement:  NA  Insight:  NA and Lacking  Psychomotor Activity:  Decreased  Concentration:  Attention Span: NA  Recall:  NA  Fund of Knowledge:  NA  Language:  NA  Akathisia:  No   Handed:  Right  AIMS (if indicated):   n/a  Assets:  Housing Intimacy Social Support  ADL's:  Impaired sedated in ICU bed  Cognition:  Impaired,  Mild  Sleep:   Sedated     Treatment Plan Summary: Daily contact with patient to assess and evaluate symptoms and progress in treatment and Will reinitiate care with patient once out of ICU.  Disposition: At intake, offered resources for substance abuse and AA, patient does not feel that he wants treatment at this time.  Will reassess patient after ICU and evaluate for inpatient psychiatry treatment versus substance use treatment.  Mariel Craft, MD 09/11/2018 1:06 PM

## 2018-09-11 NOTE — Progress Notes (Signed)
Subjective: Patient is currently sedated on Precedex drip.  Per nursing report, began to exhibit symptoms of alcohol withdrawal with notable worsening tremors, agitation, paroxysmal sweats, visual and tactile hallucination.  He was transferred to stepdown unit and started on Precedex drip.  Objective: Current vital signs: BP (!) 130/91   Pulse 64   Temp 97.6 F (36.4 C) (Axillary)   Resp 16   Ht 5\' 6"  (1.676 m)   Wt 72.8 kg   SpO2 91%   BMI 25.90 kg/m  Vital signs in last 24 hours: Temp:  [97.5 F (36.4 C)-99.1 F (37.3 C)] 97.6 F (36.4 C) (02/12 0800) Pulse Rate:  [64-145] 64 (02/12 0900) Resp:  [16-27] 16 (02/12 0900) BP: (113-157)/(87-104) 130/91 (02/12 0900) SpO2:  [86 %-97 %] 91 % (02/12 0900) Weight:  [72.8 kg] 72.8 kg (02/11 2258)  Intake/Output from previous day: 02/11 0701 - 02/12 0700 In: 103 [I.V.:103] Out: 853 [Urine:851; Stool:2] Intake/Output this shift: Total I/O In: 94.8 [I.V.:13.1; IV Piggyback:81.7] Out: 0  Nutritional status:  Diet Order            Diet regular Room service appropriate? Yes; Fluid consistency: Thin  Diet effective now             Mental Status: Patient does not respond to verbal stimuli.  Currently sedated.  Opens eyes and grimaces to deep sternal rub.  Does not follow commands.  No verbalizations noted.  Cranial Nerves: II: patient does not respond confrontation bilaterally, pupils right 2 mm, left 2 mm,and reactive bilaterally III,IV,VI: Blinks to visual threats bilaterally.  V,VII: corneal reflex present bilaterally  VIII: patient does not respond to verbal stimuli IX,X: gag reflex deffered, XI: trapezius strength unable to test bilaterally XII: tongue strength unable to test Motor: Moves bilateral upper and lower extremities spontaneously and to noxious stimuli Sensory: Respond to noxious stimuli in all extremity. Deep Tendon Reflexes:  Absent throughout. Plantars: downgoing bilaterally Cerebellar: Unable to  perform  Lab Results: Basic Metabolic Panel: Recent Labs  Lab 09/09/18 2148 09/10/18 0349 09/11/18 0502  NA 135 139 138  K 2.9* 3.7 3.0*  CL 95* 103 105  CO2 17* 27 25  GLUCOSE 199* 110* 111*  BUN 10 7 9   CREATININE 0.70 0.53* 0.50*  CALCIUM 10.2 9.5 9.1  MG  --   --  2.1    Liver Function Tests: Recent Labs  Lab 09/09/18 2148  AST 296*  ALT 186*  ALKPHOS 102  BILITOT 1.3*  PROT 8.1  ALBUMIN 4.9   No results for input(s): LIPASE, AMYLASE in the last 168 hours. No results for input(s): AMMONIA in the last 168 hours.  CBC: Recent Labs  Lab 09/09/18 2148 09/10/18 0349 09/11/18 0502  WBC 10.2 6.8 4.7  NEUTROABS 4.4  --   --   HGB 15.5 14.7 14.2  HCT 45.6 42.7 41.5  MCV 95.4 93.4 95.0  PLT 102* 77* 72*    Cardiac Enzymes: No results for input(s): CKTOTAL, CKMB, CKMBINDEX, TROPONINI in the last 168 hours.  Lipid Panel: No results for input(s): CHOL, TRIG, HDL, CHOLHDL, VLDL, LDLCALC in the last 168 hours.  CBG: Recent Labs  Lab 09/10/18 2316  GLUCAP 114*    Microbiology: Results for orders placed or performed during the hospital encounter of 09/09/18  MRSA PCR Screening     Status: None   Collection Time: 09/10/18 11:19 PM  Result Value Ref Range Status   MRSA by PCR NEGATIVE NEGATIVE Final    Comment:  The GeneXpert MRSA Assay (FDA approved for NASAL specimens only), is one component of a comprehensive MRSA colonization surveillance program. It is not intended to diagnose MRSA infection nor to guide or monitor treatment for MRSA infections. Performed at [facility], [address] [place], [place] [address]     Coagulation Studies: No results for input(s): LABPROT, INR in the last 72 hours.  Imaging: Ct Head Wo Contrast  Result Date: 09/10/2018 CLINICAL DATA:  Seizure.  Fall with reported head injury. EXAM: CT HEAD WITHOUT CONTRAST TECHNIQUE: Contiguous axial images were obtained from the base of the skull through the  vertex without intravenous contrast. COMPARISON:  CT 09/09/2018 FINDINGS: Brain: There is no evidence of acute intracranial hemorrhage, mass lesion, brain edema or extra-axial fluid collection. The ventricles and subarachnoid spaces are appropriately sized for age. There is no CT evidence of acute cortical infarction. Vascular:  No hyperdense vessel identified. Skull: Negative for fracture or focal lesion. Sinuses/Orbits: The visualized paranasal sinuses and mastoid air cells are clear. No orbital abnormalities are seen. Other: None. IMPRESSION: Stable normal noncontrast head CT. Electronically Signed   By: Carey Bullocks M.D.   On: 09/10/2018 16:01   Ct Head Wo Contrast  Result Date: 09/09/2018 CLINICAL DATA:  Seizure. EXAM: CT HEAD WITHOUT CONTRAST TECHNIQUE: Contiguous axial images were obtained from the base of the skull through the vertex without intravenous contrast. COMPARISON:  None. FINDINGS: Brain: No evidence of acute infarction, hemorrhage, hydrocephalus, extra-axial collection or mass lesion/mass effect. Vascular: Mild calcific atherosclerotic disease of the intra cavernous carotid arteries. Skull: Normal. Negative for fracture or focal lesion. Sinuses/Orbits: No acute finding. Other: None. IMPRESSION: No acute intracranial abnormality. Electronically Signed   By: Ted Mcalpine M.D.   On: 09/09/2018 23:04    Medications:  I have reviewed the patient's current medications. Prior to Admission:  Medications Prior to Admission  Medication Sig Dispense Refill Last Dose  . gabapentin (NEURONTIN) 100 MG capsule Take 1 capsule (100 mg total) by mouth 2 (two) times daily. (Patient not taking: Reported on 09/10/2018) 60 capsule 1 Not Taking at Unknown time  . mirtazapine (REMERON) 15 MG tablet Take 0.5 tablets (7.5 mg total) by mouth at bedtime. (Patient not taking: Reported on 09/10/2018) 15 tablet 1 Not Taking at Unknown time   Scheduled: . chlordiazePOXIDE  25 mg Oral QID  . folic acid  1  mg Oral Daily  . LORazepam  0-4 mg Intravenous Q6H   Followed by  . [START ON 09/12/2018] LORazepam  0-4 mg Intravenous Q12H  . multivitamin with minerals  1 tablet Oral Daily  . thiamine  100 mg Intravenous Daily  . thiamine  100 mg Oral Daily    Assessment: 58 y.o male with past medical history of alcohol abuse presenting to the ED  On 09/09/2018 with witnessed seizure-like activity.  Etiology likely alcohol withdrawal seizures.  No new episodes of seizures or seizure-like activity. However patient went into detox last evening with associated withdrawal symptoms of worsening tremors, agitation,paroxysmal sweats, visual and tactile hallucination per nursing staff. Patient transferred to step down unit for management of withdrawal symptoms. Currently on precedex gtt.  Plan: 1. Folate and thiamine replacement 2. Seizure precautions 3. Ativan prn for seizure activity and per CIWA protocol. Will attempt to gradually wean off precedex as tolerated 4. Patient would benefit from resources for Assistance in Long-Term Abstinence from Alcohol Use.  He is in agreement with enrolling in our program assist with alcohol cessation.  This patient was  staffed with Dr. Loretha Brasil, Doyle Askew who personally evaluated patient, reviewed documentation and agreed with assessment and plan of care as above.  Webb Silversmith, DNP, FNP-BC Board certified Nurse Practitioner Neurology Department   LOS: 0 days    09/11/2018  12:43 PM

## 2018-09-12 LAB — CBC
HCT: 44.7 % (ref 39.0–52.0)
Hemoglobin: 15.2 g/dL (ref 13.0–17.0)
MCH: 32 pg (ref 26.0–34.0)
MCHC: 34 g/dL (ref 30.0–36.0)
MCV: 94.1 fL (ref 80.0–100.0)
PLATELETS: 103 10*3/uL — AB (ref 150–400)
RBC: 4.75 MIL/uL (ref 4.22–5.81)
RDW: 14.6 % (ref 11.5–15.5)
WBC: 9.6 10*3/uL (ref 4.0–10.5)
nRBC: 0 % (ref 0.0–0.2)

## 2018-09-12 LAB — BASIC METABOLIC PANEL
Anion gap: 9 (ref 5–15)
BUN: 10 mg/dL (ref 6–20)
CO2: 26 mmol/L (ref 22–32)
Calcium: 10.1 mg/dL (ref 8.9–10.3)
Chloride: 103 mmol/L (ref 98–111)
Creatinine, Ser: 0.69 mg/dL (ref 0.61–1.24)
GFR calc Af Amer: >60 mL/min (ref >60)
GFR calc non Af Amer: >60 mL/min (ref >60)
Glucose, Bld: 112 mg/dL — ABNORMAL HIGH (ref 70–99)
Potassium: 3.7 mmol/L (ref 3.5–5.1)
Sodium: 138 mmol/L (ref 135–145)

## 2018-09-12 MED ORDER — LORAZEPAM 2 MG/ML IJ SOLN
1.0000 mg | Freq: Three times a day (TID) | INTRAMUSCULAR | Status: DC
  Administered 2018-09-12 – 2018-09-14 (×6): 1 mg via INTRAVENOUS
  Filled 2018-09-12 (×7): qty 1

## 2018-09-12 MED ORDER — HALOPERIDOL LACTATE 5 MG/ML IJ SOLN
INTRAMUSCULAR | Status: AC
  Administered 2018-09-12: 5 mg via INTRAVENOUS
  Filled 2018-09-12: qty 1

## 2018-09-12 MED ORDER — DIAZEPAM 5 MG/ML IJ SOLN: 2.5000 mg | INTRAMUSCULAR | Status: DC | PRN

## 2018-09-12 MED ORDER — ZIPRASIDONE MESYLATE 20 MG IM SOLR: 10.0000 mg | Freq: Once | INTRAMUSCULAR | Status: DC

## 2018-09-12 MED ORDER — HALOPERIDOL LACTATE 5 MG/ML IJ SOLN
5.0000 mg | Freq: Once | INTRAMUSCULAR | Status: AC
  Administered 2018-09-12: 5 mg via INTRAVENOUS

## 2018-09-12 NOTE — Plan of Care (Signed)
Issues with agitation when he was transferred from one room to another.  Still requiring Precedex.  During multidisciplinary rounds discussed having low-dose basal benzodiazepine and wean off Precedex as tolerated.  Continue CIWA scale.  Hopefully will clear delirium over the next 12 to 24 hours.

## 2018-09-12 NOTE — Progress Notes (Signed)
[facility] - [place] at [facility]   PATIENT NAME: Chad SpeakMark Innes    MR#:  [id number]  DATE OF BIRTH:  10/01/1960  SUBJECTIVE:  CHIEF COMPLAINT: Patient is resting comfortably.  Still on Precedex drip   REVIEW OF SYSTEMS:  Review of system unobtainable as the patient is lethargic  DRUG ALLERGIES:   Allergies  Allergen Reactions  . Opium Itching    VITALS:  Blood pressure 103/75, pulse 65, temperature 100 F (37.8 C), temperature source Oral, resp. rate 18, height 5\' 6"  (1.676 m), weight 72.8 kg, SpO2 95 %.  PHYSICAL EXAMINATION:  GENERAL:  58 y.o.-year-old patient lying in the bed with no acute distress.  EYES: Pupils equal, round, reactive to light and accommodation. No scleral icterus. Extraocular muscles intact.  HEENT: Head atraumatic, normocephalic. Oropharynx and nasopharynx clear.  NECK:  Supple, no jugular venous distention. No thyroid enlargement, no tenderness.  LUNGS: Normal breath sounds bilaterally, no wheezing, rales,rhonchi or crepitation. No use of accessory muscles of respiration.  CARDIOVASCULAR: S1, S2 normal. No murmurs, rubs, or gallops.  ABDOMEN: Soft, nontender, nondistended. Bowel sounds present.   EXTREMITIES: No pedal edema, cyanosis, or clubbing.  NEUROLOGIC: Altered  pSYCHIATRIC: The patient is on Precedex SKIN: No obvious rash, lesion, or ulcer.    LABORATORY PANEL:   CBC Recent Labs  Lab 09/12/18 0457  WBC 9.6  HGB 15.2  HCT 44.7  PLT 103*   ------------------------------------------------------------------------------------------------------------------  Chemistries  Recent Labs  Lab 09/09/18 2148  09/11/18 0502  09/12/18 0457  NA 135   < > 138  --  138  K 2.9*   < > 3.0*   < > 3.7  CL 95*   < > 105  --  103  CO2 17*   < > 25  --  26  GLUCOSE 199*   < > 111*  --  112*  BUN 10   < > 9  --  10  CREATININE 0.70   < > 0.50*  --  0.69  CALCIUM 10.2   < > 9.1  --  10.1  MG  --   --  2.1  --   --   AST  296*  --   --   --   --   ALT 186*  --   --   --   --   ALKPHOS 102  --   --   --   --   BILITOT 1.3*  --   --   --   --    < > = values in this interval not displayed.   ------------------------------------------------------------------------------------------------------------------  Cardiac Enzymes No results for input(s): TROPONINI in the last 168 hours. ------------------------------------------------------------------------------------------------------------------  RADIOLOGY:  Ct Head Wo Contrast  Result Date: 09/10/2018 CLINICAL DATA:  Seizure.  Fall with reported head injury. EXAM: CT HEAD WITHOUT CONTRAST TECHNIQUE: Contiguous axial images were obtained from the base of the skull through the vertex without intravenous contrast. COMPARISON:  CT 09/09/2018 FINDINGS: Brain: There is no evidence of acute intracranial hemorrhage, mass lesion, brain edema or extra-axial fluid collection. The ventricles and subarachnoid spaces are appropriately sized for age. There is no CT evidence of acute cortical infarction. Vascular:  No hyperdense vessel identified. Skull: Negative for fracture or focal lesion. Sinuses/Orbits: The visualized paranasal sinuses and mastoid air cells are clear. No orbital abnormalities are seen. Other: None. IMPRESSION: Stable normal noncontrast head CT. Electronically Signed   By: Carey BullocksWilliam  Veazey M.D.   On: 09/10/2018 16:01  EKG:   Orders placed or performed during the hospital encounter of 11/15/15  . EKG 12-Lead  . EKG 12-Lead    ASSESSMENT AND PLAN:    #Seizure seems to be from alcohol withdrawal CIWA protocol Precedex drip to be weaned off as tolerated.  Patient is getting agitated intermittently Benzodiazepine for withdrawal Neurology and psychiatry consult placed CT head negative  #Hypokalemia Resolved and potassium is at 3.7 Check magnesium level  #Generalized weakness Patient is on multivitamin thiamine and folate PT consult need to be placed  again once patient is medically stable  #Thrombocytopenia-probably from alcohol abuse Avoid antiplatelet agents Platelet count at 10 3000   All the records are reviewed and case discussed with Care Management/Social Workerr. Management plans discussed with the patient, family and they are in agreement.  CODE STATUS: fc   TOTAL TIME TAKING CARE OF THIS PATIENT: 35  minutes.   POSSIBLE D/C IN 1 DAYS, DEPENDING ON CLINICAL CONDITION.  Note: This dictation was prepared with Dragon dictation along with smaller phrase technology. Any transcriptional errors that result from this process are unintentional.   Ramonita Lab M.D on 09/12/2018 at 3:38 PM  Between 7am to 6pm - Pager - [phone] After 6pm go to [email or web address] - password [email or web address]  [facility]  Office  [phone]  CC: Primary care physician; Patient, No Pcp Per

## 2018-09-12 NOTE — Progress Notes (Signed)
Pt was being moved from room CCU 6 to CCU 18, pt got very agitated, yelling at this point 4 staff was holding pt code 300 was called. Pt family Homero Fellers was called, he also spoke him to over the phone and on his way to the hosp. NP in the unit and pt was restarted on precedex gtt. BPD at bedside now

## 2018-09-13 LAB — BASIC METABOLIC PANEL
ANION GAP: 10 (ref 5–15)
BUN: 11 mg/dL (ref 6–20)
CO2: 24 mmol/L (ref 22–32)
Calcium: 9.2 mg/dL (ref 8.9–10.3)
Chloride: 103 mmol/L (ref 98–111)
Creatinine, Ser: 0.59 mg/dL — ABNORMAL LOW (ref 0.61–1.24)
GFR calc Af Amer: >60 mL/min (ref >60)
GFR calc non Af Amer: >60 mL/min (ref >60)
Glucose, Bld: 97 mg/dL (ref 70–99)
POTASSIUM: 3.1 mmol/L — AB (ref 3.5–5.1)
Sodium: 137 mmol/L (ref 135–145)

## 2018-09-13 LAB — CBC
HCT: 41.8 % (ref 39.0–52.0)
Hemoglobin: 14.5 g/dL (ref 13.0–17.0)
MCH: 32.7 pg (ref 26.0–34.0)
MCHC: 34.7 g/dL (ref 30.0–36.0)
MCV: 94.4 fL (ref 80.0–100.0)
NRBC: 0 % (ref 0.0–0.2)
Platelets: 129 10*3/uL — ABNORMAL LOW (ref 150–400)
RBC: 4.43 MIL/uL (ref 4.22–5.81)
RDW: 14.5 % (ref 11.5–15.5)
WBC: 6.3 10*3/uL (ref 4.0–10.5)

## 2018-09-13 LAB — MAGNESIUM: Magnesium: 1.8 mg/dL (ref 1.7–2.4)

## 2018-09-13 MED ORDER — MAGNESIUM SULFATE 2 GM/50ML IV SOLN
2.0000 g | Freq: Once | INTRAVENOUS | Status: AC
  Administered 2018-09-13: 2 g via INTRAVENOUS
  Filled 2018-09-13: qty 50

## 2018-09-13 MED ORDER — ENOXAPARIN SODIUM 40 MG/0.4ML ~~LOC~~ SOLN
40.0000 mg | Freq: Every day | SUBCUTANEOUS | Status: DC
  Administered 2018-09-13 – 2018-09-14 (×2): 40 mg via SUBCUTANEOUS
  Filled 2018-09-13 (×2): qty 0.4

## 2018-09-13 MED ORDER — POTASSIUM CHLORIDE CRYS ER 20 MEQ PO TBCR
40.0000 meq | EXTENDED_RELEASE_TABLET | ORAL | Status: AC
  Administered 2018-09-13 (×2): 40 meq via ORAL
  Filled 2018-09-13 (×2): qty 2

## 2018-09-13 NOTE — Progress Notes (Addendum)
Pharmacy Electrolyte Monitoring Consult:  Pharmacy consulted to assist in monitoring and replacing electrolytes in this 58 y.o. male admitted on 09/09/2018 with alcohol withdrawal.   Labs:  Sodium (mmol/L)  Date Value  09/13/2018 137   Potassium (mmol/L)  Date Value  09/13/2018 3.1 (L)   Magnesium (mg/dL)  Date Value  36/06/2448 2.1   Calcium (mg/dL)  Date Value  75/30/0511 9.2   Albumin (g/dL)  Date Value  09/10/1733 4.9    Assessment/Plan: Will order potassium PO Q4hr x 2 doses. Magnesium 2 g IV x 1. Will check BMP and magnesium with am labs.   Will replace for goal potassium > 3.6 and goal magnesium ~ 2.   Pharmacy will continue to monitor and adjust per consult.   Simpson,Michael L 09/13/2018 2:53 PM

## 2018-09-13 NOTE — Progress Notes (Signed)
[facility] - [place] at [facility]   PATIENT NAME: Chad Parrish    MR#:  [id number]  DATE OF BIRTH:  1960/12/29  SUBJECTIVE:  CHIEF COMPLAINT: Patient is resting comfortably.  off Precedex drip   Somewhat jittery REVIEW OF SYSTEMS:  Review of systems  CONSTITUTIONAL: No fever, fatigue or weakness.  EYES: No blurred or double vision.  EARS, NOSE, AND THROAT: No tinnitus or ear pain.  RESPIRATORY: No cough, shortness of breath, wheezing or hemoptysis.  CARDIOVASCULAR: No chest pain, orthopnea, edema.  GASTROINTESTINAL: No nausea, vomiting, diarrhea or abdominal pain.  GENITOURINARY: No dysuria, hematuria.  ENDOCRINE: No polyuria, nocturia,  HEMATOLOGY: No anemia, easy bruising or bleeding SKIN: No rash or lesion. MUSCULOSKELETAL: No joint pain or arthritis.   NEUROLOGIC: No tingling, numbness, weakness.  PSYCHIATRY: No anxiety or depression.   DRUG ALLERGIES:   Allergies  Allergen Reactions  . Opium Itching    VITALS:  Blood pressure 119/87, pulse (!) 112, temperature 97.9 F (36.6 C), temperature source Oral, resp. rate (!) 23, height 5\' 6"  (1.676 m), weight 72.8 kg, SpO2 94 %.  PHYSICAL EXAMINATION:  GENERAL:  58 y.o.-year-old patient lying in the bed with no acute distress.  EYES: Pupils equal, round, reactive to light and accommodation. No scleral icterus. Extraocular muscles intact.  HEENT: Head atraumatic, normocephalic. Oropharynx and nasopharynx clear.  NECK:  Supple, no jugular venous distention. No thyroid enlargement, no tenderness.  LUNGS: Normal breath sounds bilaterally, no wheezing, rales,rhonchi or crepitation. No use of accessory muscles of respiration.  CARDIOVASCULAR: S1, S2 normal. No murmurs, rubs, or gallops.  ABDOMEN: Soft, nontender, nondistended. Bowel sounds present.   EXTREMITIES: No pedal edema, cyanosis, or clubbing.  NEUROLOGIC: Awake alert oriented x3, jittery pSYCHIATRIC: The patient is on Precedex SKIN: No  obvious rash, lesion, or ulcer.    LABORATORY PANEL:   CBC Recent Labs  Parrish 09/13/18 0644  WBC 6.3  HGB 14.5  HCT 41.8  PLT 129*   ------------------------------------------------------------------------------------------------------------------  Chemistries  Recent Labs  Parrish 09/09/18 2148  09/13/18 0644  NA 135   < > 137  K 2.9*   < > 3.1*  CL 95*   < > 103  CO2 17*   < > 24  GLUCOSE 199*   < > 97  BUN 10   < > 11  CREATININE 0.70   < > 0.59*  CALCIUM 10.2   < > 9.2  MG  --    < > 1.8  AST 296*  --   --   ALT 186*  --   --   ALKPHOS 102  --   --   BILITOT 1.3*  --   --    < > = values in this interval not displayed.   ------------------------------------------------------------------------------------------------------------------  Cardiac Enzymes No results for input(s): TROPONINI in the last 168 hours. ------------------------------------------------------------------------------------------------------------------  RADIOLOGY:  No results found.  EKG:   Orders placed or performed during the hospital encounter of 11/15/15  . EKG 12-Lead  . EKG 12-Lead    ASSESSMENT AND PLAN:    #Seizure seems to be from alcohol withdrawal Clinically better transfer to the floor stepdown CIWA protocol Precedex drip  weaned off .   Benzodiazepine for withdrawal Seen by psychiatry  CT head negative  #Hypokalemia Replete and recheck potassium at 3.1 Check magnesium level  #Generalized weakness Patient is on multivitamin thiamine and folate PT consult need to be placed again once patient is medically stable  #Thrombocytopenia-probably from alcohol abuse  Avoid antiplatelet agents Platelet count at 129000   All the records are reviewed and case discussed with Care Management/Social Workerr. Management plans discussed with the patient, he is in agreement CODE STATUS: fc   TOTAL TIME TAKING CARE OF THIS PATIENT: 35  minutes.   POSSIBLE D/C IN 1 DAYS,  DEPENDING ON CLINICAL CONDITION.  Note: This dictation was prepared with Dragon dictation along with smaller phrase technology. Any transcriptional errors that result from this process are unintentional.   Chad Parrish M.D on 09/13/2018 at 3:37 PM  Between 7am to 6pm - Pager - [phone] After 6pm go to [email or web address] - password [email or web address]  [facility]  Office  [phone]  CC: Primary care physician; Patient, No Pcp Per

## 2018-09-13 NOTE — Progress Notes (Signed)
   09/13/18 1500  Clinical Encounter Type  Visited With Patient  Visit Type Initial  Referral From Chaplain  Consult/Referral To Chaplain  Spiritual Encounters  Spiritual Needs Emotional  Chaplain was rounding and heard patient order his dinner, so decided to stop and speak. Chaplain introduced herself as Orthoptist and patient said hello. Chaplain ask how was he feeling, patient was sitting up in bed. Patient said he was doing good and that there are a lot of things he took for granted before he came here. Patient said he enjoyed his walk today even though it was a little hard. Chaplain said yes am sure but the fact you could get up and walk was very good. Patient said yes. Chaplain ask was there anything that she could do and patient said no thank you and I am glad you came to see me. Chaplain gave her blessings.

## 2018-09-13 NOTE — Care Management Note (Signed)
Case Management Note  Patient Details  Name: Chad Parrish MRN: [id number] Date of Birth: [birth year]  Subjective/Objective:    Patient admitted with alcohol withdrawal requiring ICU monitoring.  Patient is doing well today but still having significant tremors.  Patient reports that he lives at home with his significant other Chad Parrish.  Patient reports that Chad Parrish is very supportive.  PT recommends home health services but the patient does not want anyone coming out to his home.  Patient reports that financial counselors have come by and given him recommendations for obtaining financial assistance.  Patient reports that he knows he needs to quit drinking and that is his plan.  Patient reports that he has no needs, he reports he does not need a walker he just needs to get up and walk.  RNCM signed off. Robbie Lis RN BSN  [phone]                 Action/Plan:   Expected Discharge Date:                  Expected Discharge Plan:  Home/Self Care  In-House Referral:     Discharge planning Services  CM Consult  Post Acute Care Choice:    Choice offered to:     DME Arranged:    DME Agency:     HH Arranged:  Patient Refused [facility] HH Agency:     Status of Service:  Completed, signed off  If discussed at [employer] of Stay Meetings, dates discussed:    Additional Comments:  Allayne Butcher, RN 09/13/2018, 11:45 AM

## 2018-09-14 DIAGNOSIS — F10239 Alcohol dependence with withdrawal, unspecified: Secondary | ICD-10-CM

## 2018-09-14 DIAGNOSIS — F3289 Other specified depressive episodes: Secondary | ICD-10-CM

## 2018-09-14 DIAGNOSIS — R569 Unspecified convulsions: Secondary | ICD-10-CM

## 2018-09-14 LAB — BASIC METABOLIC PANEL
Anion gap: 8 (ref 5–15)
BUN: 12 mg/dL (ref 6–20)
CO2: 26 mmol/L (ref 22–32)
Calcium: 9.2 mg/dL (ref 8.9–10.3)
Chloride: 104 mmol/L (ref 98–111)
Creatinine, Ser: 0.65 mg/dL (ref 0.61–1.24)
GFR calc Af Amer: >60 mL/min (ref >60)
Glucose, Bld: 98 mg/dL (ref 70–99)
POTASSIUM: 4 mmol/L (ref 3.5–5.1)
Sodium: 138 mmol/L (ref 135–145)

## 2018-09-14 LAB — MAGNESIUM: MAGNESIUM: 2.3 mg/dL (ref 1.7–2.4)

## 2018-09-14 MED ORDER — ADULT MULTIVITAMIN W/MINERALS CH: 1.0000 | ORAL_TABLET | Freq: Every day | ORAL | 1 refills | Status: DC

## 2018-09-14 MED ORDER — FOLIC ACID 1 MG PO TABS: 1.0000 mg | ORAL_TABLET | Freq: Every day | ORAL | 1 refills | Status: DC

## 2018-09-14 MED ORDER — THIAMINE HCL 100 MG PO TABS: 100.0000 mg | ORAL_TABLET | Freq: Every day | ORAL | 1 refills | Status: DC

## 2018-09-14 NOTE — Care Management (Signed)
PT previously recommended and patient refused. PT was requested again but they will not be able to see him. Patient has refused PT throughout since he does not want anyone in his home. Patient continues to refuse. MD aware. Per previous documentation patient has already seen financial counselor but he denies it. Left voicemail with financial counselor again at their request.

## 2018-09-14 NOTE — Progress Notes (Signed)
Discharge teaching given to patient, patient verbalized understanding and had no questions. Patient IV removed. Patient will be transported home by family. All patient belongings gathered prior to leaving.  

## 2018-09-14 NOTE — Progress Notes (Signed)
Pharmacy Electrolyte Monitoring Consult:  Pharmacy consulted to assist in monitoring and replacing electrolytes in this 58 y.o. male admitted on 09/09/2018 with alcohol withdrawal.   Labs:  Sodium (mmol/L)  Date Value  09/14/2018 138   Potassium (mmol/L)  Date Value  09/14/2018 4.0   Magnesium (mg/dL)  Date Value  47/03/6282 2.3   Calcium (mg/dL)  Date Value  66/29/4765 9.2   Albumin (g/dL)  Date Value  46/50/3546 4.9    Assessment/Plan: All electrolytes are wnl today: no replacement required. We will follow-up on electrolyte abnormalities, including phosphorous, with tomorrow's labs.  Pharmacy will continue to monitor and adjust per consult.   Lowella Bandy, PharmD 09/14/2018 9:13 AM

## 2018-09-14 NOTE — Progress Notes (Signed)
Patient ID: Chad Parrish, male   DOB: 1960-09-09, 58 y.o.   MRN: [id number] got message from care management that patient is declining charity PT. They would not allow home health to come home. Will discharge patient home.

## 2018-09-14 NOTE — Consult Note (Signed)
[facility] Face-to-Face Psychiatry Consult   Reason for Consult:  Alcohol withdrawal seizures Referring Physician:  Dr. Amado Coe Patient Identification: Chad Parrish MRN:  [id number] Principal Diagnosis: Alcohol withdrawal seizure (HCC) Diagnosis:  Principal Problem:   Alcohol withdrawal seizure (HCC)   Total Time spent with patient: 45 minutes  Subjective: "I am going to stop drinking, he never went to have seizures like that again."  HPI:  Chad Parrish is a 58 y.o. male patient admitted after presenting  to the ED after seizure.  He has a history of significant alcohol use, and states that he did not drink for the last 24 hours.   Per chart review:  Patient has been in the care of outpatient psychiatrist, Dr. Elna Breslow (encounter date October 03, 2017): History of Present Illness (reviewed and updated)  Caucasian male, with significant other, un-employed, lives in [place] with S.O., has a history of alcohol use disorder, mood lability, chronic pain, abnormal liver function, presented to the clinic today to establish care.  - he struggles with irritability and anger issues.  He reports this has been ongoing since the past 6 years or so.  Patient reports it is getting worse and he felt like he needed to get help.  Patient reports he does not like people.  Patient reports that he has been losing his temper for simple reasons like losing his usual parking space and so on.  Pt reports he has never gotten violent or aggressive.  Patient denies any legal issues except for  some legal problems for drug issues 28 years ago when he was placed in jail for 2 months.  - reports appetite problems.  Patient reports he never eats.  Patient reports he has struggled with appetite  since he were a child.  He reports his mom had taken him to a pediatrician when he was younger however he did not recommend any medications at that time.  Patient reports he eats may be a small snack or a meal per day and that is all he takes.  PMD  has prescribed him folic acid as well as thiamine but he is not compliant with it.  - reports he has sleep issues when he does not drink.  Reports his doctor had prescribed him melatonin but he never took it.  - struggles with alcoholism.  He has been drinking since the age of 58 years old.  Patient reports he drinks 10-12  beer per night.  Patient reports he goes to work ,comes back home ,drinks beer and goes to bed.  Patient reports he is able to wake up every day at 6 AM in spite of drinking too much.  Patient reports he has no problem going back to work every day even though he drinks a lot.  Patient does report some withdrawal symptoms like bilateral hand tremors.  Patient however denies any serious withdrawal symptoms from his drinking.  - uses cannabis on weekends.  Patient reports that he has been doing so since the past several years.  He reports $40 worth of cannabis lasting for a month.  - denies any history of depression, anxiety, perceptual disturbances, suicidality or homicidality.  - denies any history of trauma.  - lives with his partner of 41 years.    Associated Signs/Symptoms: Depression Symptoms:  disturbed sleep, decreased appetite, mood lability (Hypo) Manic Symptoms:  Irritable Mood, Anxiety Symptoms:  denies Psychotic Symptoms:  denies PTSD Symptoms: Negative Past Psychiatric History: Patient denies ever being to any inpatient mental health hospitals  before.  Patient denies any suicide attempts.  Patient denies any previous psychiatric treatment. Previous Psychotropic Medications: Yes melatonin Substance Abuse History in the last 12 months:  Yes.  Alcohol since the age of 44. He drinks 12 beers per day .Cannabis since the past several years - 40 $ worth will last for a month. He smokes every weekend. Consequences of Substance Abuse: Medical Consequences:  AST elevated Withdrawal Symptoms:   Tremors   On evaluation, patient reports that he is "feeling  better."  He is still unsteady on his feet, and requires some assistance, however he is hopeful that this will be the time that he gives up alcohol.  He is still reluctant to start treatment, but is open to taking resources.  Patient significant other is at the bedside and is encouraging patient to get enrolled into Medicaid in order to have treatment for both depression and ongoing alcohol rehabilitation support.  While patient was in ICU, mirtazapine was discontinued.  At this time, recommend that patient follow-up with an outpatient mental health provider and has been given resources for day [facility].  Patient is denying suicidal and homicidal ideation.  He is looking forward to discharge today.  He denies auditory and visual hallucinations, any psychotic or manic symptoms.  Patient significant other Chalmers Guest at the bedside and is able to contract for safety.  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 Patient has not maintained on this medication. Patient reports a history of anger episodes.  Risk to Self:  Denies Risk to Others:  Denies Prior Inpatient Therapy:  None Prior Outpatient Therapy:  Yes, most recently with DrElna Breslow, in [facility].  Patient has not had any counseling.  He has not attended AA, and reports "I do not like working in groups".  Past Medical History:  Past Medical History:  Diagnosis Date  . Abnormal liver function 01/2017  . Chronic hip pain 2017    Past Surgical History:  Procedure Laterality Date  . NO PAST SURGERIES     Family History:  Family History  Problem Relation Age of Onset  . Lung cancer Mother   . Mental illness Maternal Aunt    Family Psychiatric  History: Maternal aunt had bipolar disorder and died by suicide  Social History:  Social History    Substance and Sexual Activity  Alcohol Use Yes  . Alcohol/week: 70.0 standard drinks  . Types: 70 Cans of beer per week   Comment: 10 beers a day / or more. everyday     Social History   Substance and Sexual Activity  Drug Use Yes  . Types: Marijuana    Social History   Socioeconomic History  . Marital status: Single    Spouse name: Not on file  . Number of children: Not on file  . Years of education: Not on file  . Highest education level: Not on file  Occupational History  . Not on file  Social Needs  . Financial resource strain: Not on file  . Food insecurity:    Worry: Not on file    Inability: Not on file  . Transportation needs:    Medical: No    Non-medical: No  Tobacco Use  . Smoking status: Never Smoker  . Smokeless tobacco: Never Used  Substance and Sexual Activity  . Alcohol use: Yes  Alcohol/week: 70.0 standard drinks    Types: 70 Cans of beer per week    Comment: 10 beers a day / or more. everyday  . Drug use: Yes    Types: Marijuana  . Sexual activity: Not Currently  Lifestyle  . Physical activity:    Days per week: 0 days    Minutes per session: 0 min  . Stress: Not on file  Relationships  . Social connections:    Talks on phone: Not on file    Gets together: Not on file    Attends religious service: Never    Active member of club or organization: No    Attends meetings of clubs or organizations: Never    Relationship status: Not on file  Other Topics Concern  . Not on file  Social History Narrative  . Not on file   Additional Social History:    Patient lives with significant other, Homero Fellers of 41 years. He is unemployed since February 13, 2018.  Drinks 8 to 1212 ounce beers daily Uses marijuana most evenings, "to help me sleep."  Allergies:   Allergies  Allergen Reactions  . Opium Itching    Labs:  Results for orders placed or performed during the hospital encounter of 09/09/18 (from the past 48 hour(s))  CBC     Status:  Abnormal   Collection Time: 09/13/18  6:44 AM  Result Value Ref Range   WBC 6.3 4.0 - 10.5 K/uL   RBC 4.43 4.22 - 5.81 MIL/uL   Hemoglobin 14.5 13.0 - 17.0 g/dL   HCT 46.2 70.3 - 50.0 %   MCV 94.4 80.0 - 100.0 fL   MCH 32.7 26.0 - 34.0 pg   MCHC 34.7 30.0 - 36.0 g/dL   RDW 93.8 18.2 - 99.3 %   Platelets 129 (L) 150 - 400 K/uL   nRBC 0.0 0.0 - 0.2 %    Comment: Performed at [facility], [address] [place], [place] [address]  Basic metabolic panel     Status: Abnormal   Collection Time: 09/13/18  6:44 AM  Result Value Ref Range   Sodium 137 135 - 145 mmol/L   Potassium 3.1 (L) 3.5 - 5.1 mmol/L   Chloride 103 98 - 111 mmol/L   CO2 24 22 - 32 mmol/L   Glucose, Bld 97 70 - 99 mg/dL   BUN 11 6 - 20 mg/dL   Creatinine, Ser 7.89 (L) 0.61 - 1.24 mg/dL   Calcium 9.2 8.9 - 38.1 mg/dL   GFR calc non Af Amer >60 >60 mL/min   GFR calc Af Amer >60 >60 mL/min   Anion gap 10 5 - 15    Comment: Performed at [facility], [address] [place], [place] [address]  Magnesium     Status: None   Collection Time: 09/13/18  6:44 AM  Result Value Ref Range   Magnesium 1.8 1.7 - 2.4 mg/dL    Comment: Performed at [facility], [address] [place], [place] [address]  Basic metabolic panel     Status: None   Collection Time: 09/14/18  6:08 AM  Result Value Ref Range   Sodium 138 135 - 145 mmol/L   Potassium 4.0 3.5 - 5.1 mmol/L   Chloride 104 98 - 111 mmol/L   CO2 26 22 - 32 mmol/L   Glucose, Bld 98 70 - 99 mg/dL   BUN 12 6 - 20 mg/dL   Creatinine, Ser 2.77 0.61 - 1.24 mg/dL  Calcium 9.2 8.9 - 10.3 mg/dL   GFR calc non Af Amer >60 >60 mL/min   GFR calc Af Amer >60 >60 mL/min   Anion gap 8 5 - 15    Comment: Performed at [facility], [address] [place], [place] [address]  Magnesium     Status: None   Collection Time: 09/14/18  6:08 AM  Result Value Ref Range   Magnesium 2.3 1.7 - 2.4 mg/dL    Comment: Performed at [facility], [address] [place], [place] [address]    Current Facility-Administered Medications  Medication Dose Route Frequency Provider Last Rate Last Dose  . acetaminophen (TYLENOL) tablet 650 mg  650 mg Oral Q6H PRN Oralia ManisWillis, David, MD       Or  . acetaminophen (TYLENOL) suppository 650 mg  650 mg Rectal Q6H PRN Oralia ManisWillis, David, MD      . diazepam (VALIUM) injection 2.5 mg  2.5 mg Intravenous Q4H PRN Erin FullingKasa, Kurian, MD      . enoxaparin (LOVENOX) injection 40 mg  40 mg Subcutaneous Daily Salena SanerGonzalez, Carmen L, MD   40 mg at 09/14/18 0959  . folic acid (FOLVITE) tablet 1 mg  1 mg Oral Daily Oralia ManisWillis, David, MD   1 mg at 09/14/18 0959  . LORazepam (ATIVAN) injection 2 mg  2 mg Intravenous Q4H PRN Oralia ManisWillis, David, MD   2 mg at 09/12/18 0630  . multivitamin with minerals tablet 1 tablet  1 tablet Oral Daily Mariel CraftMaurer, Wolfe Camarena M, MD   1 tablet at 09/14/18 [phone]  . ondansetron (ZOFRAN) injection 4 mg  4 mg Intravenous Q6H PRN Oralia ManisWillis, David, MD      . thiamine (VITAMIN B-1) tablet 100 mg  100 mg Oral Daily Mariel CraftMaurer, Nichlos Kunzler M, MD   100 mg at 09/14/18 [id number]   Current Outpatient Medications  Medication Sig Dispense Refill  . [START ON 09/15/2018] folic acid (FOLVITE) 1 MG tablet Take 1 tablet (1 mg total) by mouth daily. 30 tablet 1  . [START ON 09/15/2018] Multiple Vitamin (MULTIVITAMIN WITH MINERALS) TABS tablet Take 1 tablet by mouth daily. 30 tablet 1  . [START ON 09/15/2018] thiamine 100 MG tablet Take 1 tablet (100 mg total) by mouth daily. 30 tablet 1    Musculoskeletal: Strength & Muscle Tone: within normal limits Gait & Station: unsteady Patient leans: N/A  Psychiatric Specialty Exam: Physical Exam  Nursing note and vitals reviewed. Constitutional: He is oriented to person, place, and time. He appears well-developed and well-nourished. No distress.  HENT:  Head: Normocephalic and atraumatic.  Eyes: EOM are normal.  Neck: Normal range of motion.  Cardiovascular: Regular rhythm.  tachycardia   Respiratory: Effort normal. No respiratory distress.  Musculoskeletal: Normal range of motion.  Neurological: He is alert and oriented to person, place, and time.  Skin: Skin is warm and dry.    Review of Systems  Constitutional: Negative.   HENT: Negative.   Respiratory: Negative.  Negative for shortness of breath.   Cardiovascular: Negative for chest pain and palpitations.  Gastrointestinal: Negative for abdominal pain and nausea.  Musculoskeletal: Negative.   Neurological: Negative for tremors.  Psychiatric/Behavioral: Positive for substance abuse (alcohol). Negative for depression, hallucinations, memory loss and suicidal ideas. The patient is not nervous/anxious and does not have insomnia.     Blood pressure (!) 131/99, pulse 85, temperature 98.4 F (36.9 C), temperature source Oral, resp. rate 16, height 5\' 6"  (1.676 m), weight 72.8 kg, SpO2 100 %.Body mass index is 25.9  kg/m.  General Appearance: Casual  Eye Contact:  Good  Speech:  Clear and Coherent and Normal Rate  Volume:  Normal  Mood:  Euthymic  Affect:  Congruent  Thought Process:  Goal Directed  Orientation:  Full (Time, Place, and Person)  Thought Content:  Illogical and Hallucinations: None  Suicidal Thoughts:  No  Homicidal Thoughts:  No  Memory:  fair  Judgement:  Fair  Insight:  Fair  Psychomotor Activity:  Normal  Concentration:  Attention Span: Fair  Recall:  Poor  Fund of Knowledge:  Fair  Language:  Fair  Akathisia:  No  Handed:  Right  AIMS (if indicated):   n/a  Assets:  Housing Intimacy Social Support  ADL's:  Intact  Cognition:  WNL  Sleep:   pheromone     Treatment Plan Summary: Plan Patient provided with resources for outpatient follow-up for mental health services and substance use treatment.  Disposition: No evidence of imminent risk to self or others at present.   Patient does not meet criteria for psychiatric inpatient admission. Supportive therapy provided about ongoing  stressors. Discussed crisis plan, support from social network, calling 911, coming to the Emergency Department, and calling Suicide Hotline.   He was able to engage in safety planning including plan to return to [facility] or contact emergency services if he feels unable to maintain his own safety or the safety of others. Pt had no further questions, comments, or concerns.     Mariel Craft, MD 09/14/2018 6:11 PM

## 2018-09-14 NOTE — Discharge Instructions (Addendum)
Alcohol Abuse and Nutrition Alcohol abuse is any pattern of alcohol consumption that harms your health, relationships, or work. Alcohol abuse can cause poor nutrition (malnutrition or malnourishment) and a lack of nutrients (nutrient deficiencies), which can lead to more complications. Alcohol abuse brings malnutrition and nutrient deficiencies in two ways:  It causes your liver to work abnormally. This affects how your body divides (breaks down) and absorbs nutrients from food.  It causes you to eat poorly. Many people who abuse alcohol do not eat enough carbohydrates, protein, fat, vitamins, and minerals. Nutrients that are commonly lacking (deficient) in people who abuse alcohol include:  Vitamins. ? Vitamin A. This is needed for your vision, metabolism, and ability to fight off infections (immunity). ? B vitamins. These include folate, thiamine, and niacin. These are needed for new cell growth. ? Vitamin C. This plays an important role in wound healing, immunity, and helping your body to absorb iron. ? Vitamin D. This is necessary for your body to absorb and use calcium. It is produced by your liver, but you can also get it from food and from sun exposure.  Minerals. ? Calcium. This is needed for healthy bones as well as heart and blood vessel (cardiovascular) function. ? Iron. This is important for blood, muscle, and nervous system functioning. ? Magnesium. This plays an important role in muscle and nerve function, and it helps to control blood sugar and blood pressure. ? Zinc. This is important for the normal functioning of your nervous system and digestive system (gastrointestinal tract). If you think that you have an alcohol dependency problem, or if it is hard to stop drinking because you feel sick or different when you do not use alcohol, talk with your health care provider or another health professional about where to get help. Nutrition is an essential factor in therapy for  alcohol abuse. Your health care provider or diet and nutrition specialist (dietitian) will work with you to design a plan that can help to restore nutrients to your body and prevent the risk of complications. What is my plan? Your dietitian may develop a specific eating plan that is based on your condition and any other problems that you have. An eating plan will commonly include:  A balanced diet. ? Grains: 6-8 oz (170-227 g) a day. Examples of 1 oz of whole grains include 1 cup of whole-wheat cereal,  cup of brown rice, or 1 slice of whole-wheat bread. ? Vegetables: 2-3 cups a day. Examples of 1 cup of vegetables include 2 medium carrots, 1 large tomato, or 2 stalks of celery. ? Fruits: 1-2 cups a day. Examples of 1 cup of fruit include 1 large banana, 1 small apple, 8 large strawberries, or 1 large orange. ? Meat and other protein: 5-6 oz (142-170 g) a day.  A cut of meat or fish that is the size of a deck of cards is about 3-4 oz.  Foods that provide 1 oz of protein include 1 egg,  cup of nuts or seeds, or 1 tablespoon (16 g) of peanut butter. ? Dairy: 2-3 cups a day. Examples of 1 cup of dairy include 8 oz (230 mL) of milk, 8 oz (230 g) of yogurt, or 1 oz (44 g) of natural cheese.  Vitamin and mineral supplements. What are tips for following this plan?  Eat frequent meals and snacks. Try to eat 5-6 small meals each day.  Take vitamin or mineral supplements as recommended by your dietitian.  If you are  malnourished or if your dietitian recommends it: ? You may follow a high-protein, high-calorie diet. This may include:  2,000-3,000 calories (kilocalories) a day.  70-100 g (grams) of protein a day. ? You may be directed to follow a diet that includes a complete nutritional supplement beverage. This can help to restore calories, protein, and vitamins to your body. Depending on your condition, you may be advised to consume this beverage instead of your meals or in addition to  them.  Certain medicines may cause changes in your appetite, taste, and weight. Work with your health care provider and dietitian to make any changes to your medicines and eating plan.  If you are unable to take in enough food and calories by mouth, your health care provider may recommend a feeding tube. This tube delivers nutritional supplements directly to your stomach. Recommended foods  Eat foods that are high in molecules that prevent oxygen from reacting with your food (antioxidants). These foods include grapes, berries, nuts, green tea, and dark green or orange vegetables. Eating these can help to prevent some of the stress that is placed on your liver by consuming alcohol.  Eat a variety of fresh fruits and vegetables each day. This will help you to get fiber and vitamins in your diet.  Drink plenty of water and other clear fluids, such as apple juice and broth. Try to drink at least 48-64 oz (1.5-2 L) of water a day.  Include foods fortified with vitamins and minerals in your diet. Commonly fortified foods include milk, orange juice, cereal, and bread.  Eat a variety of foods that are high in omega-3 and omega-6 fatty acids. These include fish, nuts and seeds, and soybeans. These foods may help your liver to recover and may also stabilize your mood.  If you are a vegetarian: ? Eat a variety of protein-rich foods. ? Pair whole grains with plant-based proteins at meals and snack time. For example, eat rice with beans, put peanut butter on whole-grain toast, or eat oatmeal with sunflower seeds. The items listed above may not be a complete list of foods and beverages you can eat. Contact a dietitian for more information. Foods to avoid  Avoid foods and drinks that are high in fat and sugar. Sugary drinks, salty snacks, and candy contain empty calories. This means that they lack important nutrients such as protein, fiber, and vitamins.  Avoid alcohol. This is the best way to avoid  malnutrition due to alcohol abuse. If you must drink, drink measured amounts. Measured drinking means limiting your intake to no more than 1 drink a day for nonpregnant women and 2 drinks a day for men. One drink equals 12 oz (355 mL) of beer, 5 oz (148 mL) of wine, or 1 oz (44 mL) of hard liquor.  Limit your intake of caffeine. Replace drinks like coffee and black tea with decaffeinated coffee and decaffeinated herbal tea. The items listed above may not be a complete list of foods and beverages you should avoid. Contact a dietitian for more information. Summary  Alcohol abuse can cause poor nutrition (malnutrition or malnourishment) and a lack of nutrients (nutrient deficiencies), which can lead to more health problems.  Common nutrient deficiencies include vitamin deficiencies (A, B, C, and D) and mineral deficiencies (calcium, iron, magnesium, and zinc).  Nutrition is an essential factor in therapy for alcohol abuse.  Your health care provider and dietitian can help you to develop a specific eating plan that includes a balanced diet plus vitamin  and mineral supplements. This information is not intended to replace advice given to you by your health care provider. Make sure you discuss any questions you have with your health care provider. Document Released: 05/11/2005 Document Revised: 03/20/2018 Document Reviewed: 04/03/2017 Elsevier Interactive Patient Education  2019 [employer].   Alcohol Withdrawal Syndrome When a person who drinks a lot of alcohol stops drinking, he or she may have unpleasant and serious symptoms. These symptoms are called alcohol withdrawal syndrome. This condition may be mild or severe. It can be life-threatening. It can cause:  Shaking that you cannot control (tremor).  Sweating.  Headache.  Feeling fearful, upset, grouchy, or depressed.  Trouble sleeping (insomnia).  Nightmares.  Fast or uneven heartbeats (palpitations).  Alcohol cravings.  Feeling  sick to your stomach (nausea).  Throwing up (vomiting).  Being bothered by light and sounds.  Confusion.  Trouble thinking clearly.  Not being hungry (loss of appetite).  Big changes in mood (mood swings). If you have all of the following symptoms at the same time, get help right away:  High blood pressure.  Fast heartbeat.  Trouble breathing.  Seizures.  Seeing, hearing, feeling, smelling, or tasting things that are not there (hallucinations). These symptoms are known as delirium tremens (DTs). They must be treated at the hospital right away. Follow these instructions at home:   Take over-the-counter and prescription medicines only as told by your doctor. This includes vitamins.  Do not drink alcohol.  Do not drive until your doctor says that this is safe for you.  Have someone stay with you or be available in case you need help. This should be someone you trust. This person can help you with your symptoms. He or she can also help you to not drink.  Drink enough fluid to keep your pee (urine) pale yellow.  Think about joining a support group or a treatment program to help you stop drinking.  Keep all follow-up visits as told by your doctor. This is important. Contact a doctor if:  Your symptoms get worse.  You cannot eat or drink without throwing up.  You have a hard time not drinking alcohol.  You cannot stop drinking alcohol. Get help right away if:  You have fast or uneven heartbeats.  You have chest pain.  You have trouble breathing.  You have a seizure for the first time.  You see, hear, feel, smell, or taste something that is not there.  You get very confused. Summary  When a person who drinks a lot of alcohol stops drinking, he or she may have serious symptoms. This is called alcohol withdrawal syndrome.  Delirium tremens (DTs) is a group of life-threatening symptoms. You should get help right away if you have these symptoms.  Think about  joining an alcohol support group or a treatment program. This information is not intended to replace advice given to you by your health care provider. Make sure you discuss any questions you have with your health care provider. Document Released: 01/03/2008 Document Revised: 03/23/2017 Document Reviewed: 03/23/2017 Elsevier Interactive Patient Education  2019 [employer]   Chemical Dependency Chemical dependency is an addiction to drugs or alcohol. People with this addiction repeatedly seek out and use drugs or alcohol despite negative consequences to the health and safety of themselves and others. Addiction changes the way the brain works. Because of these changes, addiction is a chronic condition. The medical term for addiction or chemical dependency is substance use disorder. The disorder can be  mild, moderate, or severe. People can be dependent on a range of substances. These include alcohol, prescription medicines, and illegal or street drugs, such as marijuana, heroin, and cocaine. What are the causes? This condition is caused by the effect that the abused substance has on the brain. What increases the risk? The following factors may make you more likely to develop this condition:  Havinga family history of chemical dependency.  Having mental health issues, such as depression, anxiety, or bipolar disorder.  Living in an environment where drugs and alcohol are easily available.  Using drugs or alcohol at a young age.  Having friends who use drugs or alcohol.  Having poor social skills.  Tending to be aggressive or impulsive. What are the signs or symptoms? Symptoms may vary depending on the substance that you are addicted to. Symptoms may include the following: Physical Symptoms  The inability to limit the use of drugs or alcohol.  Having nausea, sweating, shakiness, and anxiety when you are not using alcohol or drugs.  Needing a greater amount of drugs or alcohol to get  the same effect (developing tolerance).  A change in: ? Sleeping habits. ? Eating or appetite. ? Appearance or how you care for yourself. Emotional Symptoms  Angry outbursts.  Periods of sadness and tearfulness.  Isolation. Relationship Problems  Loved ones suggesting that you have a problem.  Increased fights.  Forgetting commitments.  Having affairs or one-night stands. Problems Related to Irresponsibility  Legal problems.  Irresponsibility with money.  Missing work.  Poor decision making. How is this diagnosed? This condition may be diagnosed based on your symptoms, your medical history, and a physical exam. You may also have blood tests and urine tests. How is this treated? Treatment for this condition depends on the substance that you are addicted to and whether your dependency is mild, moderate, or severe. Treatment options may include:  Stopping substance use safely. This may require taking medicines and being closely observed for several days.  Taking part in group and individual counseling with mental health providers who help people with chemical dependency.  Staying at a residential treatment center for several days or weeks.  Attending daily counseling sessions at a treatment center.  Taking medicine as told by your health care provider to: ? Ease symptoms and prevent complications during withdrawal. ? Treat other mental health issues, such as depression or anxiety. ? Block cravings by causing the same effects as the substance. ? Block the effects of the substance or replace good sensations with unpleasant ones.  Going to a support group to share your experience with others who are going through the same thing. These groups are an important part of long-term recovery for many people. They include 12-step programs like Alcoholics Anonymous and Narcotics Anonymous.  Recovery can be a long process. Many people who undergo treatment start using the  substance again after stopping. This is called a relapse. If you have a relapse, it does not mean that treatment will not work. Follow these instructions at home:  Avoid temptations or triggers that you associate with your use of the substance.  Learn and practice techniques for managing stress.  Have a plan for vulnerable moments.  Get phone numbers of those who are willing to help and who are committed to your recovery.  Know when and where the meetings that you have chosen will occur.  Take over-the-counter and prescription medicines only as told by your health care provider.  Keep all follow-up visits as  told by your health care provider. This is important. Contact a health care provider if:  You cannot take your medicines as told.  Your symptoms get worse.  You have trouble resisting the urge to use drugs or alcohol.  You are in pain, shaking, sweating, or feeling generally unwell.  You are losing weight without trying to. Get help right away if:  You lose consciousness.  Your breathing is slow.  Your pulse is slow or jumpy.  You have serious thoughts about hurting yourself or someone else.  You have a relapse. This information is not intended to replace advice given to you by your health care provider. Make sure you discuss any questions you have with your health care provider. Document Released: 07/11/2001 Document Revised: 08/23/2015 Document Reviewed: 03/24/2015 Elsevier Interactive Patient Education  2019 [employer]   Alcohol Use Disorder Alcohol use disorder is when your drinking disrupts your daily life. When you have this condition, you drink too much alcohol and you cannot control your drinking. Alcohol use disorder can cause serious problems with your physical health. It can affect your brain, heart, liver, pancreas, immune system, stomach, and intestines. Alcohol use disorder can increase your risk for certain cancers and cause problems with your  mental health, such as depression, anxiety, psychosis, delirium, and dementia. People with this disorder risk hurting themselves and others. What are the causes? This condition is caused by drinking too much alcohol over time. It is not caused by drinking too much alcohol only one or two times. Some people with this condition drink alcohol to cope with or escape from negative life events. Others drink to relieve pain or symptoms of mental illness. What increases the risk? You are more likely to develop this condition if:  You have a family history of alcohol use disorder.  Your culture encourages drinking to the point of intoxication, or makes alcohol easy to get.  You had a mood or conduct disorder in childhood.  You have been a victim of abuse.  You are an adolescent and: ? You have poor grades or difficulties in school. ? Your caregivers do not talk to you about saying no to alcohol, or supervise your activities. ? You are impulsive or you have trouble with self-control. What are the signs or symptoms? Symptoms of this condition include:  Drinkingmore than you want to.  Drinking for longer than you want to.  Trying several times to drink less or to control your drinking.  Spending a lot of time getting alcohol, drinking, or recovering from drinking.  Craving alcohol.  Having problems at work, at school, or at home due to drinking.  Having problems in relationships due to drinking.  Drinking when it is dangerous to drink, such as before driving a car.  Continuing to drink even though you know you might have a physical or mental problem related to drinking.  Needing more and more alcohol to get the same effect you want from the alcohol (building up tolerance).  Having symptoms of withdrawal when you stop drinking. Symptoms of withdrawal include: ? Fatigue. ? Nightmares. ? Trouble sleeping. ? Depression. ? Anxiety. ? Fever. ? Seizures. ? Severe confusion. ? Feeling  or seeing things that are not there (hallucinations). ? Tremors. ? Rapid heart rate. ? Rapid breathing. ? High blood pressure.  Drinking to avoid symptoms of withdrawal. How is this diagnosed? This condition is diagnosed with an assessment. Your health care provider may start the assessment by asking three or four questions  about your drinking. Your health care provider may perform a physical exam or do lab tests to see if you have physical problems resulting from alcohol use. She or he may refer you to a mental health professional for evaluation. How is this treated? Some people with alcohol use disorder are able to reduce their alcohol use to low-risk levels. Others need to completely quit drinking alcohol. When necessary, mental health professionals with specialized training in substance use treatment can help. Your health care provider can help you decide how severe your alcohol use disorder is and what type of treatment you need. The following forms of treatment are available:  Detoxification. Detoxification involves quitting drinking and using prescription medicines within the first week to help lessen withdrawal symptoms. This treatment is important for people who have had withdrawal symptoms before and for heavy drinkers who are likely to have withdrawal symptoms. Alcohol withdrawal can be dangerous, and in severe cases, it can cause death. Detoxification may be provided in a home, community, or primary care setting, or in a hospital or substance use treatment facility.  Counseling. This treatment is also called talk therapy. It is provided by substance use treatment counselors. A counselor can address the reasons you use alcohol and suggest ways to keep you from drinking again or to prevent problem drinking. The goals of talk therapy are to: ? Find healthy activities and ways for you to cope with stress. ? Identify and avoid the things that trigger your alcohol use. ? Help you learn how to  handle cravings.  Medicines.Medicines can help treat alcohol use disorder by: ? Decreasing alcohol cravings. ? Decreasing the positive feeling you have when you drink alcohol. ? Causing an uncomfortable physical reaction when you drink alcohol (aversion therapy).  Support groups. Support groups are led by people who have quit drinking. They provide emotional support, advice, and guidance. These forms of treatment are often combined. Some people with this condition benefit from a combination of treatments provided by specialized substance use treatment centers. Follow these instructions at home:  Take over-the-counter and prescription medicines only as told by your health care provider.  Check with your health care provider before starting any new medicines.  Ask friends and family members not to offer you alcohol.  Avoid situations where alcohol is served, including gatherings where others are drinking alcohol.  Create a plan for what to do when you are tempted to use alcohol.  Find hobbies or activities that you enjoy that do not include alcohol.  Keep all follow-up visits as told by your health care provider. This is important. How is this prevented?  If you drink, limit alcohol intake to no more than 1 drink a day for nonpregnant women and 2 drinks a day for men. One drink equals 12 oz of beer, 5 oz of wine, or 1 oz of hard liquor.  If you have a mental health condition, get treatment and support.  Do not give alcohol to adolescents.  If you are an adolescent: ? Do not drink alcohol. ? Do not be afraid to say no if someone offers you alcohol. Speak up about why you do not want to drink. You can be a positive role model for your friends and set a good example for those around you by not drinking alcohol. ? If your friends drink, spend time with others who do not drink alcohol. Make new friends who do not use alcohol. ? Find healthy ways to manage stress and emotions, such as  meditation or deep breathing, exercise, spending time in nature, listening to music, or talking with a trusted friend or family member. Contact a health care provider if:  You are not able to take your medicines as told.  Your symptoms get worse.  You return to drinking alcohol (relapse) and your symptoms get worse. Get help right away if:  You have thoughts about hurting yourself or others. If you ever feel like you may hurt yourself or others, or have thoughts about taking your own life, get help right away. You can go to your nearest emergency department or call:  Your local emergency services (911 in the [place]  A suicide crisis helpline, such as the National Suicide Prevention Lifeline at (910)012-4604. This is open 24 hours a day. Summary  Alcohol use disorder is when your drinking disrupts your daily life. When you have this condition, you drink too much alcohol and you cannot control your drinking.  Treatment may include detoxification, counseling, medicine, and support groups.  Ask friends and family members not to offer you alcohol. Avoid situations where alcohol is served.  Get help right away if you have thoughts about hurting yourself or others. This information is not intended to replace advice given to you by your health care provider. Make sure you discuss any questions you have with your health care provider. Document Released: 08/24/2004 Document Revised: 04/13/2016 Document Reviewed: 04/13/2016 Elsevier Interactive Patient Education  2019 [employer]   Coping With Non-Epileptic Seizures Epileptic seizures are caused by abnormal electrical activity in the brain, but not all seizures are caused by epilepsy. Seizures that are not caused by epilepsy are called non-epileptic seizures, and there are two types:  Physiologic non-epileptic seizure. This is also called provoked seizure or organic seizure. This type of seizure stops when the cause goes away or is treated.  Possible causes include: ? High fever. ? High or low blood sugar (glucose). ? Brain injury. ? Brain infection.  Psychogenic non-epileptic seizure (PNES). This can be caused by a mental disturbance (psychological distress), not by abnormal brain activity or brain injury. This may look similar to other types of seizures. A PNES may be called an "event" or "attack" instead of a seizure. Possible causes include: ? Stress. ? Major life events, such as divorce or death of a loved one. ? Post-traumatic stress disorder (PTSD). ? Physical or sexual abuse. ? Mental health disorders, including anxiety and depression. General treatment recommendations Physiologic non-epileptic seizure  If you have physiologic non-epileptic seizures, your health care provider will treat the cause. These seizures are not likely to return, and they do not need further treatment. PNES  Your health care provider may suspect PNES if you: ? Have seizures that keep coming back (recurring) and do not respond to seizure medicines. ? Do not show any abnormal brain activity during electrical brain activity testing (electroencephalogram, EEG).  It is very important to understand that PNES is a real illness. It does not mean that the seizure is fake. It just means that the cause is different. PNES can be treated.  You may be referred to a mental health specialist (psychiatrist). This is because PNES is a mental health disorder. Mental health treatment may include: ? Talk therapy (cognitive behavioral therapy, CBT). This is the most effective treatment. Through CBT, you will learn to identify and manage the psychological distress that leads to seizures. ? Stress reduction and relaxation techniques. ? Family therapy. ? Medicines to treat depression or anxiety.  In many cases,  knowing that seizures are not caused by epilepsy reduces or stops seizures. How to manage lifestyle changes Managing stress     Certain types of  counseling can be very helpful for managing stress. A mental health professional can assess what other treatments may also help you, such as:  Cognitive behavioral therapy.  Medicine to treat depression or anxiety.  Biofeedback. This uses signals from your body (physiological responses) to help you learn to regulate anxiety.  Meditation.  Yoga. Consider self-care strategies to lower stress levels, such as:  Talking with a trusted friend or family member about your thoughts and feelings.  Muscle relaxation and breathing exercises.  Trying activities to relieve stress, such as: ? Deep breathing. ? Listening to music. ? Exercising or taking a walk. ? Writing in a journal. ? Sales promotion account executive.  Medicines Medicines may be prescribed to treat depression or anxiety that causes non-epileptic seizures. Avoid using alcohol and other substances that may prevent your medicines from working properly (may interact). It is also important to:  Talk with your pharmacist or health care provider about all the medicines that you take, their possible side effects, and what medicines are safe to take together.  Make it your goal to take part in all treatment decisions (shared decision-making). Ask about possible side effects of medicines that your health care provider recommends, and tell him or her how you feel about having those side effects. It is best if shared decision-making with your health care provider is part of your total treatment plan. Relationships Make sure family members, friends, and coworkers are trained how to help you if you have a seizure. If you have a seizure, people near you should:  Lay you on the ground to prevent a fall.  Place a pillow or piece of clothing under your head.  Loosen any tight clothing, especially around your neck.  Turn you onto your side. If vomiting occurs, this helps keep your airway clear. Make sure people do not try to hold you down, keep you still, or  put anything in your mouth during a seizure. General instructions  Take over-the-counter and prescription medicines only as told by your health care provider.  Ask your health care provider if it is safe for you to drive.  Return to your normal activities as told by your health care provider. Ask your health care provider what activities are safe for you.  Keep all follow-up visits as told by your health care providers. This is important. Where to find support You can get support for coping with non-epileptic seizures from:  Epilepsy Foundation: www.epilepsy.com  FND Hope: www.fndhope.org  Support groups, online or in-person. Your health care provider may be able to recommend a support group in your area. Contact a health care provider if:  Your seizures change or become more frequent.  You continue to have seizures after treatment. Get help right away if:  You injure yourself during a seizure.  You have one seizure after another.  You have trouble recovering from a seizure.  You have chest pain or trouble breathing.  You have a seizure that lasts longer than 5 minutes. Summary  Seizures that are not caused by epilepsy are called non-epileptic seizures. The two types of non-epileptic seizures are physiologic non-epileptic seizure and psychogenic non-epileptic seizure (PNES).  PNES is a treatable mental health disorder that is caused by psychological distress. It is very important to work with your mental health provider to find a treatment that works for you.  Learning to cope with stress and anxiety is an important part of PNES treatment.  Make sure family members, friends, and coworkers are trained how to help you if you have a seizure. If you have a seizure, they should lay you on the ground to prevent a fall, protect your head and neck, and turn you onto your side. This information is not intended to replace advice given to you by your health care provider. Make sure  you discuss any questions you have with your health care provider. Document Released: 03/13/2017 Document Revised: 03/13/2017 Document Reviewed: 03/13/2017 Elsevier Interactive Patient Education  [employer]. Patient and his partner is advised to establish primary care physician for patient in the area.

## 2018-09-14 NOTE — Progress Notes (Signed)
   09/14/18 1440  Clinical Encounter Type  Visited With Patient and family together  Visit Type Follow-up (request for AD information)   Chaplain followed up with patient and significant other regarding request for AD.  Chaplain provided education on HCPOA and living will as well as procedural information regarding document completion.  Patient to hold on to document and review it after his discharge today.  Patient shared thoughts and feelings regarding decisions which led to his hospitalization and his desire to make some life changes.

## 2018-09-14 NOTE — Discharge Summary (Signed)
[facility] [place] [facility]   PATIENT NAME: Chad Parrish Graf    MR#:  [id number]  DATE OF BIRTH:  May 01, 1961  DATE OF ADMISSION:  09/09/2018 ADMITTING PHYSICIAN: Oralia Manisavid Willis, MD  DATE OF DISCHARGE: 09/14/2018  PRIMARY CARE PHYSICIAN: Patient, No Pcp Per    ADMISSION DIAGNOSIS:  Seizure (HCC) [R56.9] Alcohol withdrawal syndrome with complication (HCC) [F10.239]  DISCHARGE DIAGNOSIS:  alcohol related seizures alcohol withdrawal chronic alcoholism chronic tremors suspected alcohol related generealized weakness  SECONDARY DIAGNOSIS:   Past Medical History:  Diagnosis Date  . Abnormal liver function 01/2017  . Chronic hip pain 2017    HOSPITAL COURSE:  Chad Parrish Luber  is a 58 y.o. male who presents with chief complaint as above.  Patient presents to the ED after seizure.  He has a history of significant alcohol use, and states that he did not drink for the last 24 hours.    #Seizure seems to be from alcohol withdrawal Clinically better transfer to the floor stepdown received CIWA protocol Precedex drip  weaned off .   Benzodiazepine for withdrawal Seen by psychiatry-- social worker and care management have given resources for outpatient alcohol groups   CT head negative  #Hypokalemia Replete and recheck potassium at 3.1  #Generalized weakness Patient is on multivitamin thiamine and folate -PT recommends home health PT. Care management to work charity PT out  #Thrombocytopenia-probably from alcohol abuse Avoid antiplatelet agents Platelet count at 129000  #Chronic tremors-- suspected alcohol related  Very anxious to go home. His partner is in the room. Discussed at length regarding abstinence from alcohol. Patient voice understanding. Partner voiced understanding as well and they have plan to have patient join some outpatient alcohol related program.  Recommended to get primary care physician in the area. CONSULTS OBTAINED:  Treatment  Team:  Pauletta BrownsZeylikman, Yuriy, MD Mariel CraftMaurer, Sheila M, MD Clapacs, Jackquline DenmarkJohn T, MD  DRUG ALLERGIES:   Allergies  Allergen Reactions  . Opium Itching    DISCHARGE MEDICATIONS:   Allergies as of 09/14/2018      Reactions   Opium Itching      Medication List    STOP taking these medications   gabapentin 100 MG capsule Commonly known as:  NEURONTIN   mirtazapine 15 MG tablet Commonly known as:  REMERON     TAKE these medications   folic acid 1 MG tablet Commonly known as:  FOLVITE Take 1 tablet (1 mg total) by mouth daily. Start taking on:  September 15, 2018   multivitamin with minerals Tabs tablet Take 1 tablet by mouth daily. Start taking on:  September 15, 2018   thiamine 100 MG tablet Take 1 tablet (100 mg total) by mouth daily. Start taking on:  September 15, 2018       If you experience worsening of your admission symptoms, develop shortness of breath, life threatening emergency, suicidal or homicidal thoughts you must seek medical attention immediately by calling 911 or calling your MD immediately  if symptoms less severe.  You Must read complete instructions/literature along with all the possible adverse reactions/side effects for all the Medicines you take and that have been prescribed to you. Take any new Medicines after you have completely understood and accept all the possible adverse reactions/side effects.   Please note  You were cared for by a hospitalist during your hospital stay. If you have any questions about your discharge medications or the care you received while you were in the hospital after you are discharged,  you can call the unit and asked to speak with the hospitalist on call if the hospitalist that took care of you is not available. Once you are discharged, your primary care physician will handle any further medical issues. Please note that NO REFILLS for any discharge medications will be authorized once you are discharged, as it is imperative that you  return to your primary care physician (or establish a relationship with a primary care physician if you do not have one) for your aftercare needs so that they can reassess your need for medications and monitor your lab values. Today   SUBJECTIVE   Anxious to go home. Chronic tremors both upper extremity  VITAL SIGNS:  Blood pressure (!) 131/99, pulse 85, temperature 98.4 F (36.9 C), temperature source Oral, resp. rate 16, height 5\' 6"  (1.676 m), weight 72.8 kg, SpO2 100 %.  I/O:    Intake/Output Summary (Last 24 hours) at 09/14/2018 1408 Last data filed at 09/14/2018 1004 Gross per 24 hour  Intake 13.31 ml  Output 1100 ml  Net -1086.69 ml    PHYSICAL EXAMINATION:  GENERAL:  58 y.o.-year-old patient lying in the bed with no acute distress. Looks older than his age EYES: Pupils equal, round, reactive to light and accommodation. No scleral icterus. Extraocular muscles intact.  HEENT: Head atraumatic, normocephalic. Oropharynx and nasopharynx clear.  NECK:  Supple, no jugular venous distention. No thyroid enlargement, no tenderness.  LUNGS: Normal breath sounds bilaterally, no wheezing, rales,rhonchi or crepitation. No use of accessory muscles of respiration.  CARDIOVASCULAR: S1, S2 normal. No murmurs, rubs, or gallops.  ABDOMEN: Soft, non-tender, non-distended. Bowel sounds present. No organomegaly or mass.  EXTREMITIES: No pedal edema, cyanosis, or clubbing.  NEUROLOGIC: Cranial nerves II through XII are intact. Muscle strength 5/5 in all extremities. Sensation intact. Gait not checked. Overall weak however has chronic tremors both upper extremity PSYCHIATRIC: The patient is alert and oriented x 3. Somewhat  anxious SKIN: No obvious rash, lesion, or ulcer.   DATA REVIEW:   CBC  Recent Labs  Lab 09/13/18 0644  WBC 6.3  HGB 14.5  HCT 41.8  PLT 129*    Chemistries  Recent Labs  Lab 09/09/18 2148  09/14/18 0608  NA 135   < > 138  K 2.9*   < > 4.0  CL 95*   < > 104  CO2  17*   < > 26  GLUCOSE 199*   < > 98  BUN 10   < > 12  CREATININE 0.70   < > 0.65  CALCIUM 10.2   < > 9.2  MG  --    < > 2.3  AST 296*  --   --   ALT 186*  --   --   ALKPHOS 102  --   --   BILITOT 1.3*  --   --    < > = values in this interval not displayed.    Microbiology Results   Recent Results (from the past 240 hour(s))  MRSA PCR Screening     Status: None   Collection Time: 09/10/18 11:19 PM  Result Value Ref Range Status   MRSA by PCR NEGATIVE NEGATIVE Final    Comment:        The GeneXpert MRSA Assay (FDA approved for NASAL specimens only), is one component of a comprehensive MRSA colonization surveillance program. It is not intended to diagnose MRSA infection nor to guide or monitor treatment for MRSA infections. Performed at [facility],  [address], [place], [place] [address]     RADIOLOGY:  No results found.   CODE STATUS:     Code Status Orders  (From admission, onward)         Start     Ordered   09/10/18 0155  Full code  Continuous     09/10/18 0154        Code Status History    This patient has a current code status but no historical code status.    Advance Directive Documentation     Most Recent Value  Type of Advance Directive  Healthcare Power of Attorney, Living will  Pre-existing out of facility DNR order (yellow form or pink MOST form)  -  "MOST" Form in Place?  -      TOTAL TIME TAKING CARE OF THIS PATIENT: ** .    Enedina Finner M.D on 09/14/2018 at 2:08 PM  Between 7am to 6pm - Pager - [phone] After 6pm go to [email or web address] - password [email or web address]  [facility]  Office  [phone]  CC: Primary care physician; Patient, No Pcp Per

## 2018-09-20 ENCOUNTER — Telehealth: Payer: Self-pay | Admitting: Licensed Clinical Social Worker

## 2018-09-20 NOTE — Telephone Encounter (Signed)
CSW contacted patient in regards to an EMMI response he had provided. Patient denied having thoughts of self harm and stated that he was very proud of himself for not having had a drink since before his hospital admission. CSW provided encouragement and congratulated him for his sobriety. York Spaniel MSW,LCSW [phone]

## 2018-10-03 ENCOUNTER — Encounter: Payer: Self-pay | Admitting: Adult Health

## 2018-10-03 ENCOUNTER — Other Ambulatory Visit: Payer: Self-pay

## 2018-10-03 ENCOUNTER — Ambulatory Visit: Payer: 59 | Admitting: Adult Health

## 2018-10-03 VITALS — BP 117/80 | HR 90 | Temp 97.8°F | Ht 66.6 in | Wt 165.5 lb

## 2018-10-03 DIAGNOSIS — F1910 Other psychoactive substance abuse, uncomplicated: Secondary | ICD-10-CM

## 2018-10-03 DIAGNOSIS — M25552 Pain in left hip: Secondary | ICD-10-CM

## 2018-10-03 DIAGNOSIS — R945 Abnormal results of liver function studies: Secondary | ICD-10-CM

## 2018-10-03 DIAGNOSIS — R569 Unspecified convulsions: Secondary | ICD-10-CM

## 2018-10-03 DIAGNOSIS — Z0000 Encounter for general adult medical examination without abnormal findings: Secondary | ICD-10-CM

## 2018-10-03 DIAGNOSIS — G8929 Other chronic pain: Secondary | ICD-10-CM

## 2018-10-03 MED ORDER — IBUPROFEN 800 MG PO TABS: 800.0000 mg | ORAL_TABLET | Freq: Three times a day (TID) | ORAL | 3 refills | Status: DC | PRN

## 2018-10-03 MED ORDER — VITAMIN D (ERGOCALCIFEROL) 1.25 MG (50000 UNIT) PO CAPS: 50000.0000 [IU] | ORAL_CAPSULE | ORAL | 3 refills | Status: DC

## 2018-10-03 NOTE — Progress Notes (Signed)
Patient ID: Chad Parrish, male   DOB: Nov 10, 1960, 58 y.o.   MRN: [id number]  Chief Complaint  Patient presents with  . Hip Pain    left hip pain x 3-4years. No recent injury to sight. Last seen by md for hip pain 1 yr ago. No surgeries, requesting relief from hip discomfort.           HPI Chad Parrish is a 58 y.o. male who presents for an initial office visit and evaluation of alcohol abuse and left hip pain. He was recently hospitalized for alcohol withdrawal seizures. He feels better now and has not drank in 24 days. He has been drinking since age 72. He drank every day for the last 4 years and drank at least 12 bears every night.  He takes thiamine, folic acid and a multivitamin daily.  LFTs were elevated during hospitalization.  He denies any abdominal pain, nausea, and vomiting.  He has not had liver ultrasound. He is c/o chronic left hip that is chronic.  He was diagnosed with avascular necrosis of the left hip.  Left hip tube placement was recommended but he states that he does not want any surgical interventions.  He takes Motrin as needed for pain and reports good pain control with over-the-counter doses.  Unclear if he is up-to-date on his immunizations as he has not seen a healthcare provider in a while.  He reports eating a healthy diet and exercising when possible.  Past Medical History:  Diagnosis Date  . Abnormal liver function 01/2017  . Allergy   . Anxiety   . Chronic hip pain 2017  . Seizures ([place])   . Substance abuse [facility])     Past Surgical History:  Procedure Laterality Date  . NO PAST SURGERIES      Family History  Problem Relation Age of Onset  . Lung cancer Mother   . Cancer Mother   . Mental illness Maternal Aunt   . Heart disease Father   . Heart disease Maternal Grandmother     Social History Social History   Tobacco Use  . Smoking status: Never Smoker  . Smokeless tobacco: Never Used  Substance Use Topics  . Alcohol use: Not Currently   Alcohol/week: 70.0 standard drinks    Types: 70 Cans of beer per week    Comment: 10 beers a day / or more. everyday  . Drug use: Yes    Types: Marijuana    Allergies  Allergen Reactions  . Opium Itching  . Tramadol Nausea Only    Current Outpatient Medications  Medication Sig Dispense Refill  . folic acid (FOLVITE) 1 MG tablet Take 1 tablet (1 mg total) by mouth daily. 30 tablet 1  . Multiple Vitamin (MULTIVITAMIN WITH MINERALS) TABS tablet Take 1 tablet by mouth daily. 30 tablet 1  . thiamine 100 MG tablet Take 1 tablet (100 mg total) by mouth daily. 30 tablet 1   No current facility-administered medications for this visit.     Review of Systems Review of Systems  Constitutional: Negative.   HENT: Negative.   Eyes: Negative.   Respiratory: Negative.   Cardiovascular: Negative.   Gastrointestinal: Negative.   Endocrine: Negative.   Genitourinary: Negative.   Musculoskeletal: Positive for arthralgias (left hip painleft hip pain).  Skin: Negative.   Allergic/Immunologic: Negative.   Neurological: Negative.   Hematological: Negative.   Psychiatric/Behavioral: Negative.     Blood pressure 117/80, pulse 90, temperature 97.8 F (36.6 C), temperature source Oral, height 5'  6.6" (1.692 m), weight 165 lb 8 oz (75.1 kg), SpO2 97 %.  Physical Exam Physical Exam Vitals signs and nursing note reviewed.  Constitutional:      Appearance: Normal appearance.  HENT:     Head: Normocephalic and atraumatic.     Right Ear: Tympanic membrane normal.     Left Ear: Tympanic membrane normal.     Nose: Nose normal.     Mouth/Throat:     Mouth: Mucous membranes are dry.     Pharynx: No posterior oropharyngeal erythema.  Eyes:     Extraocular Movements: Extraocular movements intact.     Conjunctiva/sclera: Conjunctivae normal.     Pupils: Pupils are equal, round, and reactive to light.  Neck:     Musculoskeletal: Normal range of motion and neck supple.  Cardiovascular:     Rate  and Rhythm: Normal rate and regular rhythm.     Pulses: Normal pulses.     Heart sounds: Normal heart sounds.  Pulmonary:     Effort: Pulmonary effort is normal.     Breath sounds: Normal breath sounds.  Abdominal:     General: Bowel sounds are normal. There is no distension.     Palpations: Abdomen is soft.     Tenderness: There is no abdominal tenderness.  Genitourinary:    Comments: Deferred per patient's preference Musculoskeletal:        General: Tenderness (left hip with ROM) present.  Skin:    General: Skin is warm and dry.     Capillary Refill: Capillary refill takes less than 2 seconds.  Neurological:     General: No focal deficit present.     Mental Status: He is alert and oriented to person, place, and time.     Cranial Nerves: No cranial nerve deficit.     Sensory: No sensory deficit.     Gait: Gait normal.  Psychiatric:        Behavior: Behavior normal.     Data Reviewed Routine labs ordered  Assessment and Plan 1. Health maintenance examination Unremarkable physical exam. Will order routine screening labs - Comp Met (CMET) - TSH - Magnesium - Phosphorus - Lipid Profile - Vitamin D 1,25 dihydroxy  2. Left hip pain Continue motrin prn - Ambulatory referral to Orthopedic Surgery  3. Substance abuse (HCC)-ALCOHOL ABUSE Currently in remission. Continue abstinence. Referred to counselor  4. Seizures ([place]) Alcohol withdrawal seizures resolved  5. Abnormal liver function Due to ETOH abuse. Will obtain repeat labs. If persistently elevated, will obtain liver US and refer to GI  Magddalene S Tukov-Yual 10/03/2018, 10:13 AM

## 2018-10-04 LAB — SPECIMEN STATUS

## 2018-10-05 LAB — SPECIMEN STATUS REPORT

## 2018-10-08 ENCOUNTER — Ambulatory Visit: Payer: Medicaid Other | Admitting: Licensed Clinical Social Worker

## 2018-10-08 ENCOUNTER — Telehealth: Payer: Self-pay | Admitting: Licensed Clinical Social Worker

## 2018-10-08 DIAGNOSIS — F1021 Alcohol dependence, in remission: Secondary | ICD-10-CM

## 2018-10-08 LAB — PHOSPHORUS: Phosphorus: 4.6 mg/dL — ABNORMAL HIGH (ref 2.8–4.1)

## 2018-10-08 LAB — TSH: TSH: 2.67 u[IU]/mL (ref 0.450–4.500)

## 2018-10-08 LAB — LIPID PANEL
Chol/HDL Ratio: 3.7 ratio (ref 0.0–5.0)
Cholesterol, Total: 252 mg/dL — ABNORMAL HIGH (ref 100–199)
HDL: 69 mg/dL (ref >39)
LDL Calculated: 143 mg/dL — ABNORMAL HIGH (ref 0–99)
Triglycerides: 200 mg/dL — ABNORMAL HIGH (ref 0–149)
VLDL Cholesterol Cal: 40 mg/dL (ref 5–40)

## 2018-10-08 LAB — VITAMIN D 1,25 DIHYDROXY
Vitamin D 1, 25 (OH)2 Total: 44 pg/mL
Vitamin D2 1, 25 (OH)2: <10 pg/mL
Vitamin D3 1, 25 (OH)2: 44 pg/mL

## 2018-10-08 LAB — COMPREHENSIVE METABOLIC PANEL
ALT: 26 IU/L (ref 0–44)
AST: 27 IU/L (ref 0–40)
Albumin/Globulin Ratio: 1.8 (ref 1.2–2.2)
Albumin: 4.4 g/dL (ref 3.8–4.9)
Alkaline Phosphatase: 73 IU/L (ref 39–117)
BUN/Creatinine Ratio: 9 (ref 9–20)
BUN: 6 mg/dL (ref 6–24)
CHLORIDE: 101 mmol/L (ref 96–106)
CO2: 26 mmol/L (ref 20–29)
Calcium: 10.1 mg/dL (ref 8.7–10.2)
Creatinine, Ser: 0.7 mg/dL — ABNORMAL LOW (ref 0.76–1.27)
GFR calc Af Amer: 122 mL/min/{1.73_m2} (ref >59)
GFR calc non Af Amer: 105 mL/min/{1.73_m2} (ref >59)
GLUCOSE: 99 mg/dL (ref 65–99)
Globulin, Total: 2.5 g/dL (ref 1.5–4.5)
Potassium: 4.1 mmol/L (ref 3.5–5.2)
Sodium: 141 mmol/L (ref 134–144)
Total Protein: 6.9 g/dL (ref 6.0–8.5)

## 2018-10-08 LAB — MAGNESIUM: Magnesium: 1.8 mg/dL (ref 1.6–2.3)

## 2018-10-08 NOTE — Progress Notes (Signed)
Integrated Behavioral Health Comprehensive Clinical Assessment  MRN: [id number] Name: Chad Parrish  Type of Service: Integrated Behavioral Health-Individual Interpretor: No. Interpretor Name and Language:   PRESENTING CONCERNS: Chad Parrish is a 58 y.o. male accompanied by himself. Chad Parrish was referred to [facility] clinician for substance abuse.  Previous mental health services Have you ever been treated for a mental health problem? No If "Yes", when were you treated and whom did you see? Not applicable. Have you ever been hospitalized for mental health treatment? Negative Have you ever been treated for any of the following? Past Psychiatric History/Hospitalization(s): Anxiety: Negative Bipolar Disorder: Negative Depression: Negative Mania: Negative Psychosis: Negative Schizophrenia: Negative Personality Disorder: Negative Hospitalization for psychiatric illness: Negative History of Electroconvulsive Shock Therapy: Negative Prior Suicide Attempts: Negative Have you ever had thoughts of harming yourself or others or attempted suicide? No plan to harm self or others  Medical history  has a past medical history of Abnormal liver function (01/2017), Allergy, Anxiety, Chronic hip pain 10/18/15), Seizures ([place]), and Substance abuse ([place]). Primary Care Physician: Patient, No Pcp Per Date of last physical exam:  Allergies:  Allergies  Allergen Reactions  . Opium Itching  . Tramadol Nausea Only   Current medications:  Outpatient Encounter Medications as of 10/08/2018  Medication Sig  . folic acid (FOLVITE) 1 MG tablet Take 1 tablet (1 mg total) by mouth daily.  Marland Kitchen ibuprofen (ADVIL,MOTRIN) 800 MG tablet Take 1 tablet (800 mg total) by mouth every 8 (eight) hours as needed for moderate pain (hip pain).  . Multiple Vitamin (MULTIVITAMIN WITH MINERALS) TABS tablet Take 1 tablet by mouth daily.  Marland Kitchen thiamine 100 MG tablet Take 1 tablet (100 mg total) by mouth daily.  .  Vitamin D, Ergocalciferol, (DRISDOL) 1.25 MG (50000 UT) CAPS capsule Take 1 capsule (50,000 Units total) by mouth every 7 (seven) days.   No facility-administered encounter medications on file as of 10/08/2018.    Have you ever had any serious medication reactions? Yes- Tramadol Is there any history of mental health problems or substance abuse in your family? No Has anyone in your family been hospitalized for mental health treatment? No  Social/family history Who lives in your current household? Chad Parrish lives with his partner of 74 years. What is your family of origin, childhood history? Chad Parrish was born in [place]. Where were you born? Chad Parrish was raised in [place]. Where did you grow up? See above. How many different homes have you lived in? A few. Describe your childhood: Chad Parrish describes his childhood as being great. He explains that he played outside a lot and lived [place] the woods. Do you have siblings, step/half siblings? Yes- Chad Parrish was raised as an only child but three weeks ago disovered that he has three half brothers, a half sister, and nine nieces and nephews. He explains that his dad traveled a lot and had several others kids that he discovered through ancestry. com.  What are their names, relation, sex, age? See above. Are your parents separated or divorced? No Chad Parrish never met his dad and was raised by his mother. His mom is deceased and passed away in 10-17-04. What are your social supports? Chad Parrish. Yurkovich has the support of his partner.   Education How many grades have you completed? GED Did you have any problems in school? 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  Sleep Usual bedtime varies. Sleeping arrangements: partner. Problems with snoring: No Obstructive sleep apnea is not a concern. Problems with nightmares: No Problems with night terrors: No Problems  with sleepwalking: No  Trauma/Abuse history Have you ever experienced or been exposed to any form of abuse? No Have you ever experienced or been exposed to something traumatic? No  Substance use Do you use alcohol, nicotine or caffeine? Chad Parrish was hospitalized at [facility] on February 10th 2020 for alcohol withdrawal seizure. He was in the hospital for approximately five days in the ICU. He is thirty days sober. He was previously drinking 10 to 12 beers daily for the last five years.  How old were you when you first tasted alcohol? Teens. Have you ever used illicit drugs or abused prescription medications? marijuana 30 days ago.  Mental status General appearance/Behavior: Casual Eye contact: Good Motor behavior: Normal Speech: Normal Level of consciousness: Alert Mood: Euthymic Affect: Appropriate Anxiety level: None Thought process: Coherent Thought content: WNL Perception: Normal Judgment: Good Insight: Present  Diagnosis No diagnosis found.  GOALS ADDRESSED: Patient will reduce symptoms of: relapse prevention and increase knowledge and/or ability of: healthy habits and also: referral to substance abuse provider at [facility].              INTERVENTIONS: Interventions utilized: Link to [employer] Assessments completed: Patient declined screening   ASSESSMENT/OUTCOME:  Chad Parrish is a 58 year old Caucasian male who presents today for an assessment and was referred by Chad Nakayama NP for substance abuse. Chad Parrish reports that he was recently hospitalized on February 10th 2020 at [facility] for alcohol withdrawal seizure and placed in the ICU for 5 days. He reports that he has been drinking ten to fifteen beers a day for the last five years and can't really remember what happened the day he went to the hospital. He reports that he received a DUI at the age of 59, had to attend drug and alcohol classes through court order.  He explains that he has been to [place] in the past but it didn't stick and doesn't like to be around people he doesn't know. He denies any history of anxiety or depression. He denies ever being hospitalized for mental illness. He denies ever being prescribed any psychotropic medications for mental illness. He has not previously seen a therapist in the past.   Chad Parrish. Kise is a newly established patient at [facility]. He has a history of left hip pain for the past three to four years and was told he needs a knee replacement. He has had an adverse drug reaction to tramadol that has caused nausea and Opium causes itching.   Chad Parrish. Gabay lives with his partner of 61 years. He is unemployed due to being laid off from a [employer] in December of this past year. He is thinking of applying for disability. He has the support of his partner. He recently discovered three weeks ago that he has three half brothers, one half sister, and nine nephews and nieces through a DNA search on [email or web address]. He never met his father and his mother is deceased. He denies any history of mental illness or substance abuse in his family.   PLAN: Due to Chad Parrish. Speranza having a substance abuse diagnosis and doesn't have a mental health diagnosis, he was referred to substance abuse treatment at [facility] in [place], [place] and is supposed to establish with a sponsor, attending [place] meetings.  Scheduled next visit: No visit scheduled.   [place] [employer]

## 2018-10-08 NOTE — Telephone Encounter (Signed)
Clinician reached out to the client to do a travel screening prior to his appointment this afternoon at 3:30 pm. He responded not to all questions asked.

## 2018-10-29 ENCOUNTER — Ambulatory Visit: Payer: Self-pay | Admitting: Specialist

## 2018-11-06 ENCOUNTER — Ambulatory Visit: Payer: Self-pay | Admitting: Pharmacy Technician

## 2018-12-30 NOTE — Progress Notes (Signed)
Attempted to contact patient by phone.  Patient unavailable will leave message.  Patient provided statement regarding unemployment.  Unemployment income about to expire.  Need to verify how patient is supported.  Sherilyn Dacosta Care Manager [facility]

## 2019-02-06 ENCOUNTER — Other Ambulatory Visit: Payer: Self-pay

## 2019-02-06 ENCOUNTER — Ambulatory Visit: Payer: Medicaid Other | Admitting: Adult Health

## 2019-02-06 ENCOUNTER — Encounter: Payer: Self-pay | Admitting: Adult Health

## 2019-02-06 DIAGNOSIS — M25551 Pain in right hip: Secondary | ICD-10-CM

## 2019-02-06 DIAGNOSIS — M25552 Pain in left hip: Secondary | ICD-10-CM

## 2019-02-06 DIAGNOSIS — F1021 Alcohol dependence, in remission: Secondary | ICD-10-CM

## 2019-02-06 DIAGNOSIS — M87052 Idiopathic aseptic necrosis of left femur: Secondary | ICD-10-CM

## 2019-02-06 MED ORDER — THIAMINE HCL 100 MG PO TABS: 100.0000 mg | ORAL_TABLET | Freq: Every day | ORAL | 1 refills | Status: AC

## 2019-02-06 MED ORDER — ADULT MULTIVITAMIN W/MINERALS CH: 1.0000 | ORAL_TABLET | Freq: Every day | ORAL | 1 refills | Status: AC

## 2019-02-06 MED ORDER — VITAMIN D (ERGOCALCIFEROL) 1.25 MG (50000 UNIT) PO CAPS: 50000.0000 [IU] | ORAL_CAPSULE | ORAL | 3 refills | Status: AC

## 2019-02-06 MED ORDER — FOLIC ACID 1 MG PO TABS: 1.0000 mg | ORAL_TABLET | Freq: Every day | ORAL | 1 refills | Status: AC

## 2019-02-06 MED ORDER — IBUPROFEN 800 MG PO TABS: 800.0000 mg | ORAL_TABLET | Freq: Three times a day (TID) | ORAL | 3 refills | Status: AC | PRN

## 2019-02-06 NOTE — Patient Instructions (Signed)

## 2019-02-06 NOTE — Progress Notes (Signed)
Patient: Chad Parrish Male    DOB: 06-Sep-1960   58 y.o.   MRN: [id number] Visit Date: 02/06/2019  Today's Provider: Shawn RouteMagddalene S Tukov-Yual, NP  Patient consents to Telephone/or Telehealth visit and two identifiers have been used to establish patient's identity prior to visit  Chief Complaint  Patient presents with  . Follow-up    pain from hips down, twisted knee about 7 years ago, was told he needs hip replacement 3 years ago   Subjective:    HPI: 58 Y/O male with a h/o avascular necrosis of the left hip, chronic left knee pain and ETOH abuse who is seen via telephone visit of f/u of worsening bilateral hip and left knee pain. States that the right hip hurts more than the left. Pain is from waist down.  Worse now since he has been out of work. Pain is associated with difficulty moving bowels. He denies constipation. Rates pain as 8/10, worse in the morning, aggravated by activity and he can't think of anything that makes it better. He reports decreasing the amount of daily alcoholic drinks per day from 12 cans of bear to a few cans/day. He offers no other complaints.   Allergies  Allergen Reactions  . Opium Itching  . Tramadol Nausea Only   Previous Medications   FOLIC ACID (FOLVITE) 1 MG TABLET    Take 1 tablet (1 mg total) by mouth daily.   IBUPROFEN (ADVIL,MOTRIN) 800 MG TABLET    Take 1 tablet (800 mg total) by mouth every 8 (eight) hours as needed for moderate pain (hip pain).   MULTIPLE VITAMIN (MULTIVITAMIN WITH MINERALS) TABS TABLET    Take 1 tablet by mouth daily.   THIAMINE 100 MG TABLET    Take 1 tablet (100 mg total) by mouth daily.   VITAMIN D, ERGOCALCIFEROL, (DRISDOL) 1.25 MG (50000 UT) CAPS CAPSULE    Take 1 capsule (50,000 Units total) by mouth every 7 (seven) days.    Review of Systems  Constitutional: Negative.   Respiratory: Negative.   Cardiovascular: Negative.   Gastrointestinal: Negative.   Endocrine: Negative.   Musculoskeletal: Positive for back pain, gait  problem and joint swelling (BILATRAL HIPS AND L-KNEE).  Skin: Negative.   Neurological: Negative for tremors, weakness and numbness.  Psychiatric/Behavioral: Negative for sleep disturbance.    Social History   Tobacco Use  . Smoking status: Never Smoker  . Smokeless tobacco: Never Used  Substance Use Topics  . Alcohol use: Not Currently    Alcohol/week: 70.0 standard drinks    Types: 70 Cans of beer per week    Comment: 10 beers a day / or more. everyday   Objective:   There were no vitals taken for this visit.  Physical Exam Vitals signs and nursing note reviewed.   BP 112/78, HR 87  Unable to obtain rest of physical exam as this is a virtual visit     Assessment & Plan:  1. Avascular necrosis of bone of left hip (HCC) Patient declined surgical intervention in the past. I have reviewed with him the need for surgical intervention and the surgical risks given his continuous alcohol use. He has agreed to gradually cut back on his ETOH intake and to follow-up with ortho. Will obtain repeat x-rays - Ambulatory referral to Orthopedic Surgery - DG HIPS BILAT WITH PELVIS 3-4 VIEWS; Future  2. Bilateral hip pain He did not pick up the script for motrin 800mg  tid that was previously ordered. I have advised him to pick up  the prescription and take it as prescribed with food while awaiting ortho eval. He has been advised to go to the ED If he experiences stool or urinary incontinence - Ambulatory referral to Orthopedic Surgery - DG HIPS BILAT WITH PELVIS 3-4 VIEWS; Future  3. Alcohol dependence, in remission ([place]) Continue thiamine, folic acid and mov He has promised to continue decreasing the amount of alcohol that he drinks. F/u in 6 weeks  Deforest Hoyles, NP   [facility]

## 2019-02-13 ENCOUNTER — Ambulatory Visit: Payer: Medicaid Other | Admitting: Orthopaedic Surgery

## 2019-02-20 ENCOUNTER — Ambulatory Visit (INDEPENDENT_AMBULATORY_CARE_PROVIDER_SITE_OTHER): Payer: Medicaid Other

## 2019-02-20 ENCOUNTER — Ambulatory Visit (INDEPENDENT_AMBULATORY_CARE_PROVIDER_SITE_OTHER): Payer: Medicaid Other | Admitting: Orthopaedic Surgery

## 2019-02-20 ENCOUNTER — Other Ambulatory Visit: Payer: Self-pay

## 2019-02-20 VITALS — Ht 66.25 in | Wt 167.0 lb

## 2019-02-20 DIAGNOSIS — M25551 Pain in right hip: Secondary | ICD-10-CM

## 2019-02-20 DIAGNOSIS — M87052 Idiopathic aseptic necrosis of left femur: Secondary | ICD-10-CM

## 2019-02-20 DIAGNOSIS — F10231 Alcohol dependence with withdrawal delirium: Secondary | ICD-10-CM

## 2019-02-20 DIAGNOSIS — M25562 Pain in left knee: Secondary | ICD-10-CM | POA: Diagnosis not present

## 2019-02-20 DIAGNOSIS — F10931 Alcohol use, unspecified with withdrawal delirium: Secondary | ICD-10-CM

## 2019-02-20 DIAGNOSIS — G8929 Other chronic pain: Secondary | ICD-10-CM

## 2019-02-20 NOTE — Progress Notes (Signed)
Office Visit Note   Patient: Chad Parrish           Date of Birth: Jul 30, 1961           MRN: [id number] Visit Date: 02/20/2019              Requested by: Andreas Ohmukov-Yual, Magdalene S, NP [address] [place] [id number] PCP: Patient, No Pcp Per   Assessment & Plan: Visit Diagnoses:  1. Avascular necrosis of hip, left (HCC)   2. Chronic pain of left knee   3. Pain in right hip   4. Alcohol withdrawal seizure with delirium (HCC)     Plan: Impression is left hip avascular necrosis with early collapse of the femoral head.  It sounds like currently the pain is something that he is able to live with and he does not feel like he would want an injection but he understands that this will almost certainly result in requiring a total hip replacement down the road.  We will use hip injections judiciously given that this can worsen the femoral head collapse.  He understands that this was likely caused by his heavy alcohol use.  He is not interested in becoming sober.  For the left knee pain I feel that a large component of it is referred pain from the hip.  He really does not have any objective findings that would explain his pain.  He will take time to think about everything that we discussed today and will let us know what he would like to do about his left hip.  Follow-Up Instructions: Return if symptoms worsen or fail to improve.   Orders:  Orders Placed This Encounter  Procedures  . XR Knee 1-2 Views Left  . XR HIPS BILAT W OR W/O PELVIS 3-4 VIEWS   No orders of the defined types were placed in this encounter.     Procedures: No procedures performed   Clinical Data: No additional findings.   Subjective: Chief Complaint  Patient presents with  . Right Hip - Pain  . Left Hip - Pain  . Left Knee - Pain    Chad Parrish is a 58 year old gentleman who comes in for evaluation of chronic left hip pain for the last several years.  He states that the left hip hurts in the groin  and deep within the hip region.  He denies any injuries.  He does have previous history of heavy alcohol use in the in which he drank a 12 pack of beer a night but now he is only drinking a few beers a night on a daily basis.  He was recently admitted for alcohol withdrawal seizures.  He denies any radiation of pain past the knee from the left hip.  For the right knee he states that he twisted 8 years ago as well in this instance never been the same.  He wants to get checked out.  Denies any mechanical symptoms.  He has taken tramadol meloxicam for the hip pain and is also taking ibuprofen.   Review of Systems  Constitutional: Negative.   All other systems reviewed and are negative.    Objective: Vital Signs: Ht 5' 6.25" (1.683 m)   Wt 167 lb (75.8 kg)   BMI 26.75 kg/m   Physical Exam Vitals signs and nursing note reviewed.  Constitutional:      Appearance: He is well-developed.  HENT:     Head: Normocephalic and atraumatic.  Eyes:  Pupils: Pupils are equal, round, and reactive to light.  Neck:     Musculoskeletal: Neck supple.  Pulmonary:     Effort: Pulmonary effort is normal.  Abdominal:     Palpations: Abdomen is soft.  Musculoskeletal: Normal range of motion.  Skin:    General: Skin is warm.  Neurological:     Mental Status: He is alert and oriented to person, place, and time.  Psychiatric:        Behavior: Behavior normal.        Thought Content: Thought content normal.        Judgment: Judgment normal.     Ortho Exam Left hip exam shows painful internal and external rotation with positive FADIR and positive Stinchfield sign.  Trochanteric bursa is nontender.  No sciatic tension signs.  Left knee exam shows no joint effusion.  Normal range of motion.  Collaterals and cruciates are stable. Specialty Comments:  No specialty comments available.  Imaging: Xr Hips Bilat W Or W/o Pelvis 3-4 Views  Result Date: 02/20/2019 Left femoral head collapse consistent with  avascular necrosis.  Right hip joint is preserved.  Xr Knee 1-2 Views Left  Result Date: 02/20/2019 No significant degenerative joint disease.    PMFS History: Patient Active Problem List   Diagnosis Date Noted  . Substance abuse ([place])   . Seizures ([place])   . Alcohol withdrawal seizure ([place]) 09/10/2018  . Abnormal liver function 01/28/2017  . Acute alcoholic gastritis without hemorrhage 12/31/2015  . Avascular necrosis of bone of left hip ([place]) 12/31/2015  . Elevated liver enzymes 12/31/2015  . Uncomplicated alcohol dependence ([place]) 12/31/2015  . Chronic hip pain 08/01/2015   Past Medical History:  Diagnosis Date  . Abnormal liver function 01/2017  . Allergy   . Anxiety   . Chronic hip pain 2017  . Seizures ([place])   . Substance abuse ([place])     Family History  Problem Relation Age of Onset  . Lung cancer Mother   . Cancer Mother   . Mental illness Maternal Aunt   . Heart disease Father   . Heart disease Maternal Grandmother     Past Surgical History:  Procedure Laterality Date  . NO PAST SURGERIES     Social History   Occupational History  . Not on file  Tobacco Use  . Smoking status: Never Smoker  . Smokeless tobacco: Never Used  Substance and Sexual Activity  . Alcohol use: Not Currently    Alcohol/week: 70.0 standard drinks    Types: 70 Cans of beer per week    Comment: 10 beers a day / or more. everyday  . Drug use: Yes    Types: Marijuana  . Sexual activity: Not Currently

## 2019-04-23 ENCOUNTER — Telehealth: Payer: Self-pay | Admitting: Pharmacy Technician

## 2019-04-23 NOTE — Telephone Encounter (Signed)
Patient has Medicaid with prescription coverage.  Requested [facility] transfer prescriptions to [employer].  Gave to Zoila Shutter to transfer.  [facility]

## 2020-05-18 IMAGING — CT CT HEAD W/O CM
3 series · 15 of 47 positions shown, 18 images · non-contrast
Comparison: None.

CLINICAL DATA: Seizure.

EXAM:
CT HEAD WITHOUT CONTRAST
TECHNIQUE: Contiguous axial images were obtained from the base of the skull
through the vertex without intravenous contrast.

[Series 3: head wo · axial · 0.47mm/px · z∈[-122,+3]mm · 9 of 30 slices shown, 12 images]
[im 3/30  brain]
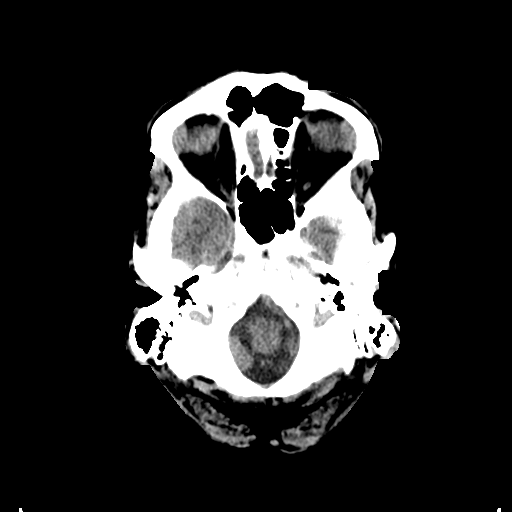
[im 3/30  bone]
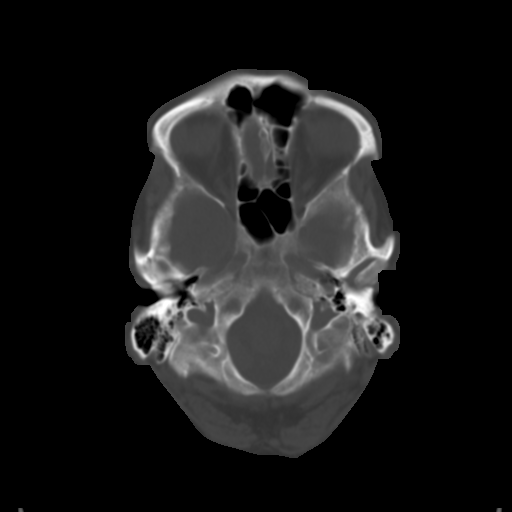
[im 6/30  brain]
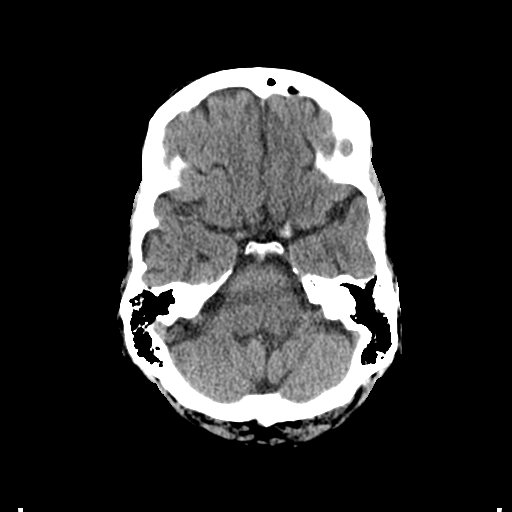
[im 9/30  brain]
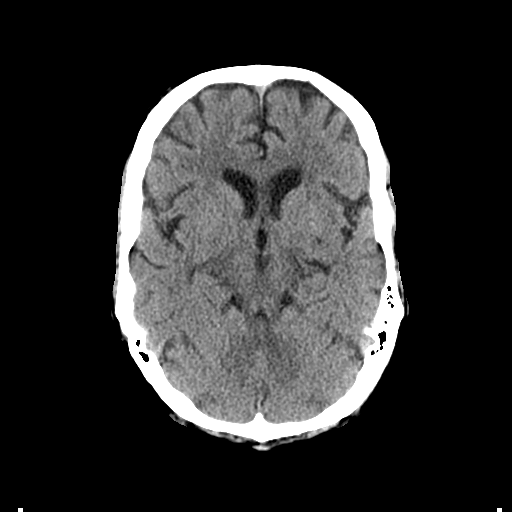
[im 12/30  brain]
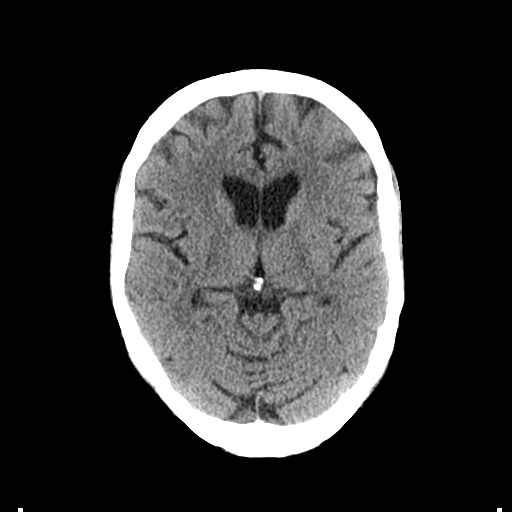
[im 16/30  brain]
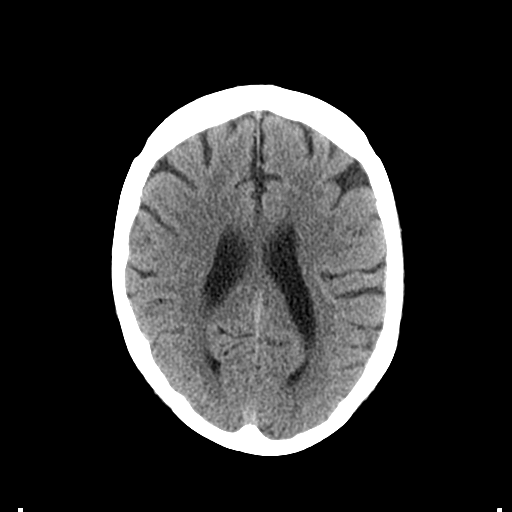
[im 16/30  bone]
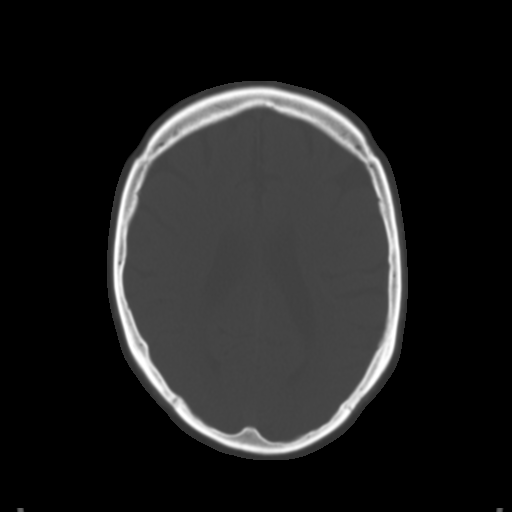
[im 19/30  brain]
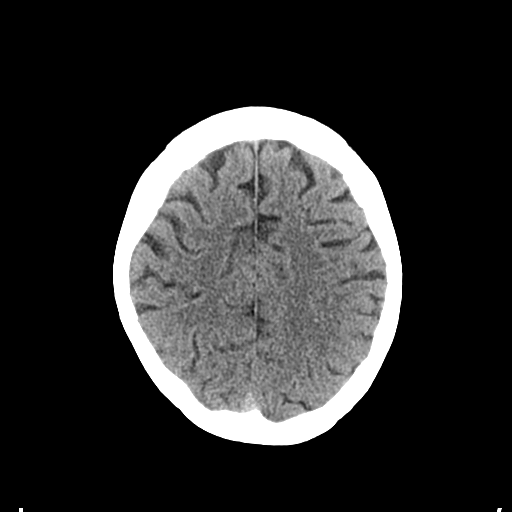
[im 22/30  brain]
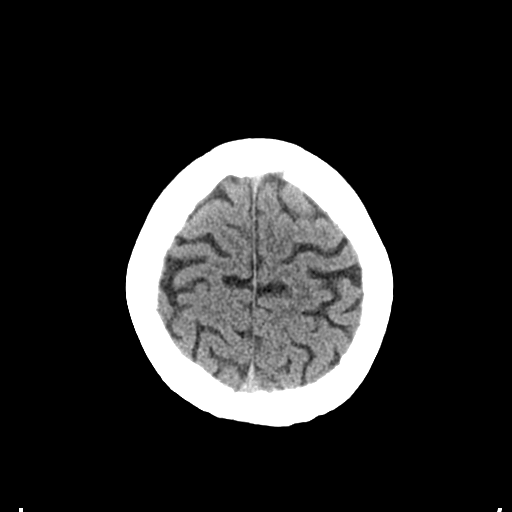
[im 25/30  brain]
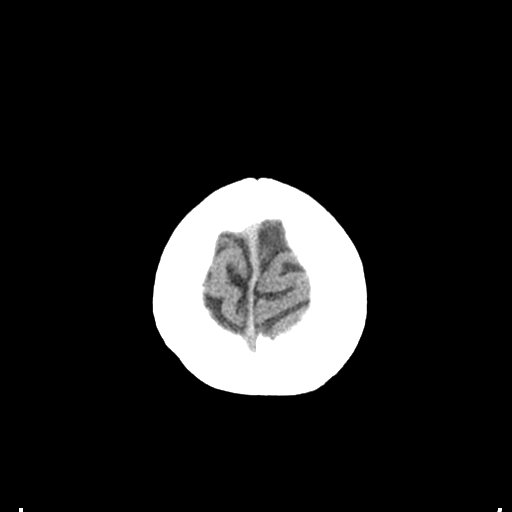
[im 28/30  brain]
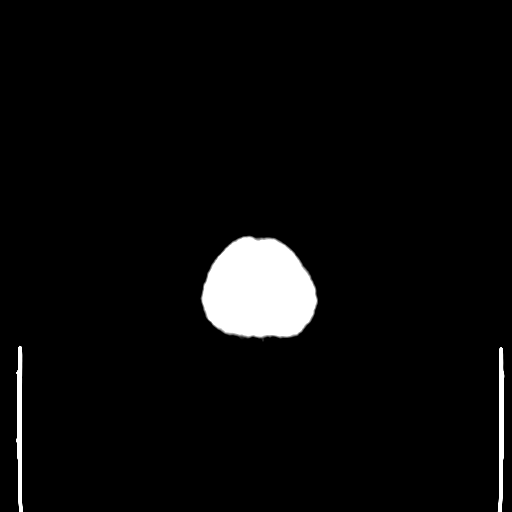
[im 28/30  bone]
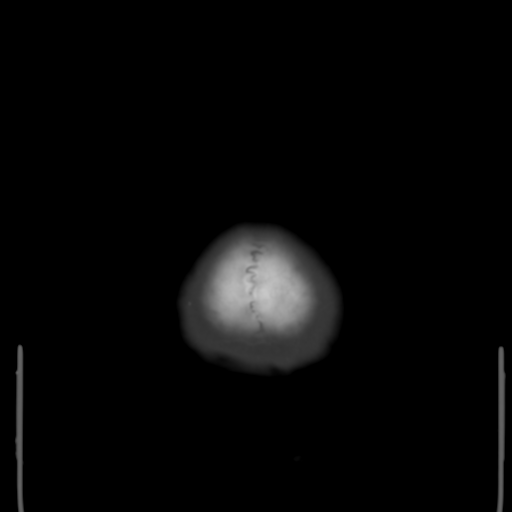

[Series 4: coronal soft tissue · coronal · 0.28mm/px · 3 of 66 slices shown]
[im 22/66  brain]
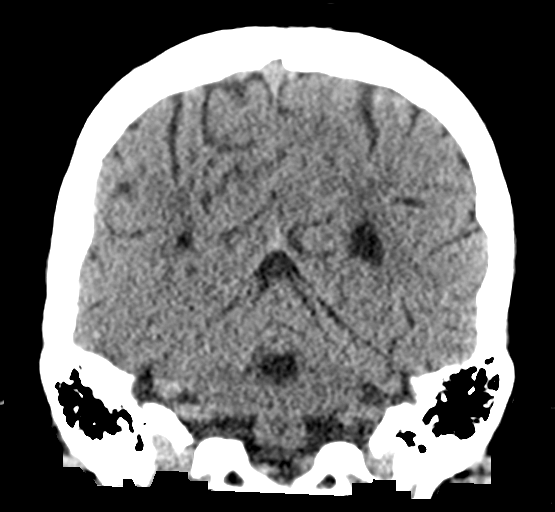
[im 29/66  brain]
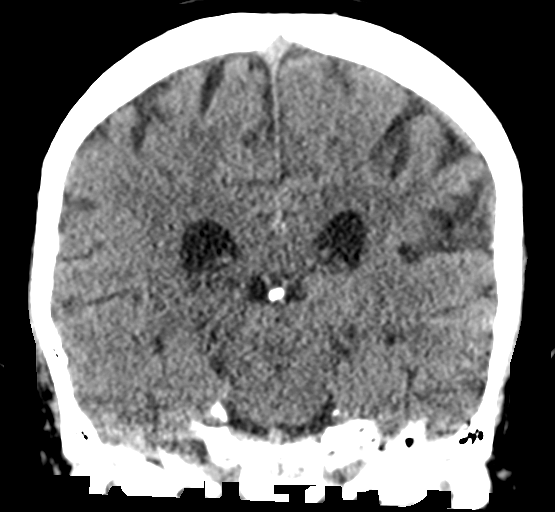
[im 37/66  brain]
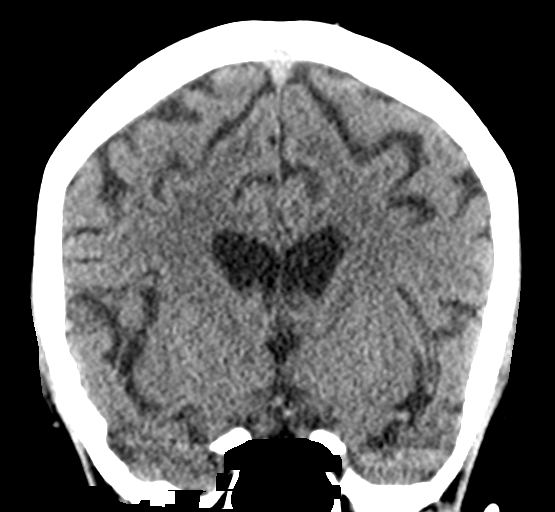

[Series 5: sagittal soft tissue · sagittal · 0.28mm/px · 3 of 52 slices shown]
[im 18/52  brain]
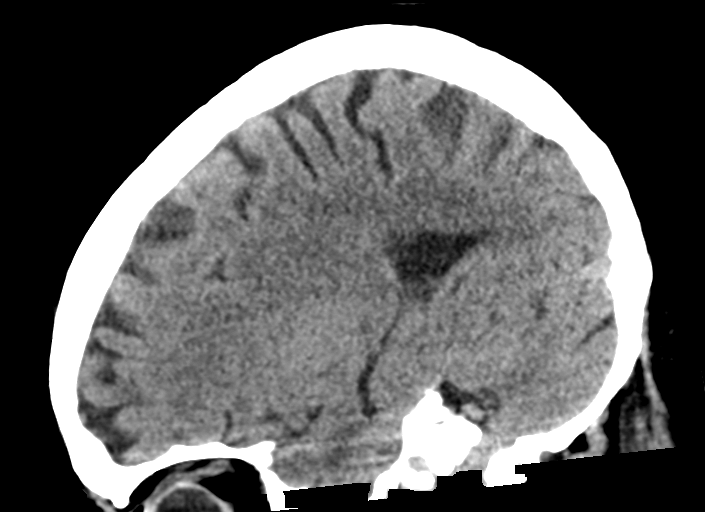
[im 26/52  brain]
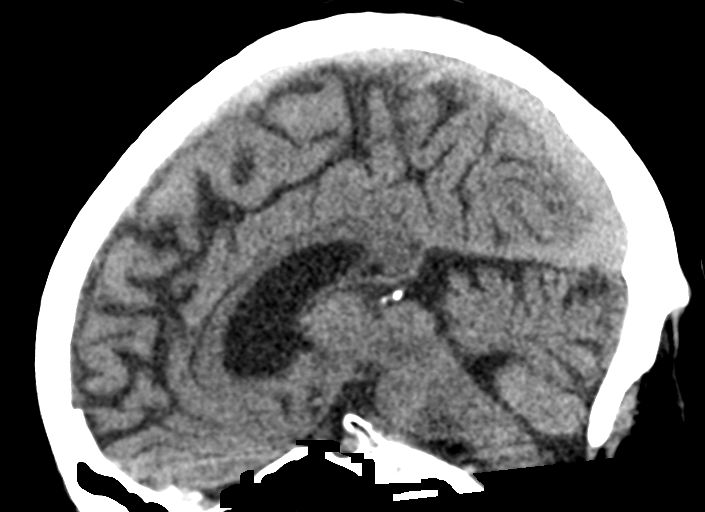
[im 35/52  brain]
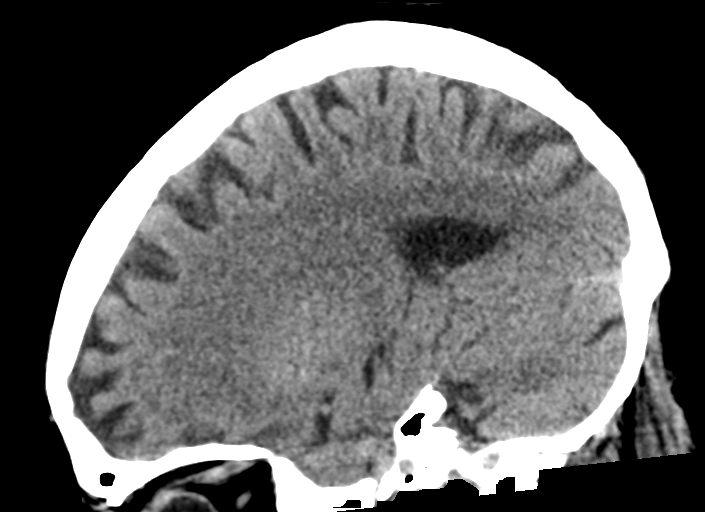

[15 of 47 positions shown; findings below may reference images not displayed]

FINDINGS: Brain: No evidence of acute infarction, hemorrhage, hydrocephalus,
extra-axial collection or mass lesion/mass effect.

Vascular: Mild calcific atherosclerotic disease of the intra
cavernous carotid arteries.

Skull: Normal. Negative for fracture or focal lesion.

Sinuses/Orbits: No acute finding.

Other: None.
IMPRESSION: No acute intracranial abnormality.

## 2021-08-16 ENCOUNTER — Other Ambulatory Visit: Payer: Self-pay

## 2021-08-16 DIAGNOSIS — Z1211 Encounter for screening for malignant neoplasm of colon: Secondary | ICD-10-CM

## 2021-08-16 MED ORDER — NA SULFATE-K SULFATE-MG SULF 17.5-3.13-1.6 GM/177ML PO SOLN: 1.0000 | Freq: Once | ORAL | 0 refills | Status: AC

## 2021-08-16 NOTE — Progress Notes (Signed)
Gastroenterology Pre-Procedure Review  Request Date: 10/13/2021 Requesting Physician: Dr. Allegra Lai  PATIENT REVIEW QUESTIONS: The patient responded to the following health history questions as indicated:    1. Are you having any GI issues? no 2. Do you have a personal history of Polyps? yes (colonoscopy done 5 yrs ago) 3. Do you have a family history of Colon Cancer or Polyps? no 4. Diabetes Mellitus? no 5. Joint replacements in the past 12 months?no 6. Major health problems in the past 3 months?no 7. Any artificial heart valves, MVP, or defibrillator?no    MEDICATIONS & ALLERGIES:    Patient reports the following regarding taking any anticoagulation/antiplatelet therapy:   Plavix, Coumadin, Eliquis, Xarelto, Lovenox, Pradaxa, Brilinta, or Effient? no Aspirin? no  Patient confirms/reports the following medications:  Current Outpatient Medications  Medication Sig Dispense Refill   folic acid (FOLVITE) 1 MG tablet Take 1 tablet (1 mg total) by mouth daily. 30 tablet 1   ibuprofen (ADVIL) 800 MG tablet Take 1 tablet (800 mg total) by mouth every 8 (eight) hours as needed for moderate pain (hip pain). 60 tablet 3   Multiple Vitamin (MULTIVITAMIN WITH MINERALS) TABS tablet Take 1 tablet by mouth daily. 30 tablet 1   thiamine 100 MG tablet Take 1 tablet (100 mg total) by mouth daily. 30 tablet 1   Vitamin D, Ergocalciferol, (DRISDOL) 1.25 MG (50000 UT) CAPS capsule Take 1 capsule (50,000 Units total) by mouth every 7 (seven) days. 5 capsule 3   No current facility-administered medications for this visit.    Patient confirms/reports the following allergies:  Allergies  Allergen Reactions   Opium Itching   Tramadol Nausea Only    No orders of the defined types were placed in this encounter.   AUTHORIZATION INFORMATION Primary Insurance: 1D#: Group #:  Secondary Insurance: 1D#: Group #:  SCHEDULE INFORMATION: Date: 10/13/2021 Time:  Location: [facility]

## 2021-08-29 ENCOUNTER — Other Ambulatory Visit: Payer: Self-pay

## 2021-08-29 ENCOUNTER — Ambulatory Visit: Payer: Medicaid Other | Attending: Nurse Practitioner

## 2021-08-29 DIAGNOSIS — M5416 Radiculopathy, lumbar region: Secondary | ICD-10-CM | POA: Insufficient documentation

## 2021-08-29 DIAGNOSIS — M79651 Pain in right thigh: Secondary | ICD-10-CM | POA: Diagnosis present

## 2021-08-29 DIAGNOSIS — M6281 Muscle weakness (generalized): Secondary | ICD-10-CM | POA: Diagnosis present
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
## 2021-09-01 ENCOUNTER — Ambulatory Visit: Payer: Medicaid Other

## 2021-09-06 ENCOUNTER — Other Ambulatory Visit: Payer: Self-pay

## 2021-09-06 ENCOUNTER — Ambulatory Visit: Payer: Medicaid Other | Attending: Nurse Practitioner

## 2021-09-06 DIAGNOSIS — M6281 Muscle weakness (generalized): Secondary | ICD-10-CM | POA: Insufficient documentation

## 2021-09-06 DIAGNOSIS — M5416 Radiculopathy, lumbar region: Secondary | ICD-10-CM | POA: Diagnosis not present

## 2021-09-06 DIAGNOSIS — M79651 Pain in right thigh: Secondary | ICD-10-CM | POA: Diagnosis present
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
## 2021-09-06 NOTE — Patient Instructions (Signed)
HEP: Access Code: [email or web address] URL: [email or web address] Date: 09/06/2021 Prepared by: Ria Comment  Exercises Supine Bridge - 1 x daily - 7 x weekly - 3 sets - 10 reps Hooklying Clamshell with Resistance - 1 x daily - 7 x weekly - 3 sets - 10 reps - 3s hold Seated Hamstring Stretch - 2-3 x daily - 7 x weekly - 3 reps - 30-45s hold

## 2021-09-08 ENCOUNTER — Ambulatory Visit: Payer: Medicaid Other

## 2021-10-11 ENCOUNTER — Telehealth: Payer: Self-pay | Admitting: Gastroenterology

## 2021-10-11 NOTE — Telephone Encounter (Signed)
Pt had questions on how and when to take prep... Instructions gone over with pt and he expressed understanding ?

## 2021-10-11 NOTE — Telephone Encounter (Signed)
Patient has questions about pre-procedure instructions. Requesting call back. ?

## 2021-10-13 ENCOUNTER — Encounter: Payer: Self-pay | Admitting: Gastroenterology

## 2021-10-13 ENCOUNTER — Encounter
Admission: RE | Disposition: A | Discharge status: Home / Self Care | Payer: Self-pay | Source: Home / Self Care | Attending: Gastroenterology

## 2021-10-13 ENCOUNTER — Ambulatory Visit: Payer: Medicaid Other | Admitting: Certified Registered Nurse Anesthetist

## 2021-10-13 ENCOUNTER — Ambulatory Visit
Admission: RE | Admit: 2021-10-13 | Discharge: 2021-10-13 | Disposition: A | Discharge status: Home / Self Care | Payer: Medicaid Other | Attending: Gastroenterology | Admitting: Gastroenterology

## 2021-10-13 DIAGNOSIS — K5730 Diverticulosis of large intestine without perforation or abscess without bleeding: Secondary | ICD-10-CM | POA: Diagnosis not present

## 2021-10-13 DIAGNOSIS — Z1211 Encounter for screening for malignant neoplasm of colon: Secondary | ICD-10-CM | POA: Diagnosis present

## 2021-10-13 DIAGNOSIS — Z860100 Personal history of colon polyps, unspecified: Secondary | ICD-10-CM

## 2021-10-13 DIAGNOSIS — K648 Other hemorrhoids: Secondary | ICD-10-CM | POA: Diagnosis not present

## 2021-10-13 HISTORY — PX: COLONOSCOPY WITH PROPOFOL: SHX5780

## 2021-10-13 SURGERY — COLONOSCOPY WITH PROPOFOL
Anesthesia: General

## 2021-10-13 MED ORDER — SIMETHICONE 40 MG/0.6ML PO SUSP
ORAL | Status: DC | PRN
  Administered 2021-10-13: 60 mL via ORAL

## 2021-10-13 MED ORDER — PROPOFOL 500 MG/50ML IV EMUL
INTRAVENOUS | Status: DC | PRN
Start: 2021-10-13 — End: 2021-10-13
  Administered 2021-10-13: 150 ug/kg/min via INTRAVENOUS

## 2021-10-13 MED ORDER — PROPOFOL 500 MG/50ML IV EMUL
INTRAVENOUS | Status: AC
Start: 2021-10-13 — End: ?
  Filled 2021-10-13: qty 50

## 2021-10-13 MED ORDER — PROPOFOL 10 MG/ML IV BOLUS
INTRAVENOUS | Status: DC | PRN
  Administered 2021-10-13: 20 mg via INTRAVENOUS
  Administered 2021-10-13: 80 mg via INTRAVENOUS

## 2021-10-13 MED ORDER — SODIUM CHLORIDE 0.9 % IV SOLN: INTRAVENOUS | Status: DC

## 2021-10-13 MED ORDER — LIDOCAINE HCL (PF) 2 % IJ SOLN
INTRAMUSCULAR | Status: AC
Start: 2021-10-13 — End: ?
  Filled 2021-10-13: qty 5

## 2021-10-13 MED ORDER — LIDOCAINE HCL (CARDIAC) PF 100 MG/5ML IV SOSY
PREFILLED_SYRINGE | INTRAVENOUS | Status: DC | PRN
  Administered 2021-10-13: 30 mg via INTRAVENOUS

## 2021-10-13 MED ORDER — DEXMEDETOMIDINE (PRECEDEX) IN NS 20 MCG/5ML (4 MCG/ML) IV SYRINGE
PREFILLED_SYRINGE | INTRAVENOUS | Status: DC | PRN
  Administered 2021-10-13: 12 ug via INTRAVENOUS

## 2021-10-13 NOTE — H&P (Signed)
? ?Midge Minium, MD [facility] ?[phone] ?[place], [place] [address] ?Phone:[phone] ?Fax : [phone] ? ?Primary Care Physician:  Emogene Morgan, MD ?Primary Gastroenterologist:  Dr. Servando Snare ? ?Pre-Procedure History & Physical: ?HPI:  Chad Parrish is a 61 y.o. male is here for an colonoscopy. ?  ?Past Medical History:  ?Diagnosis Date  ? Abnormal liver function 01/2017  ? Allergy   ? Anxiety   ? Chronic hip pain 2017  ? Seizures (HCC)   ? Substance abuse (HCC)   ? ? ?Past Surgical History:  ?Procedure Laterality Date  ? NO PAST SURGERIES    ? ? ?Prior to Admission medications   ?Medication Sig Start Date End Date Taking? Authorizing Provider  ?folic acid (FOLVITE) 1 MG tablet Take 1 tablet (1 mg total) by mouth daily. 02/06/19  Yes Tukov-Yual, Alroy Bailiff, NP  ?ibuprofen (ADVIL) 800 MG tablet Take 1 tablet (800 mg total) by mouth every 8 (eight) hours as needed for moderate pain (hip pain). 02/06/19  Yes Tukov-Yual, Alroy Bailiff, NP  ?Multiple Vitamin (MULTIVITAMIN WITH MINERALS) TABS tablet Take 1 tablet by mouth daily. 02/06/19  Yes Tukov-Yual, Alroy Bailiff, NP  ?thiamine 100 MG tablet Take 1 tablet (100 mg total) by mouth daily. 02/06/19  Yes Tukov-Yual, Alroy Bailiff, NP  ?Vitamin D, Ergocalciferol, (DRISDOL) 1.25 MG (50000 UT) CAPS capsule Take 1 capsule (50,000 Units total) by mouth every 7 (seven) days. 02/06/19  Yes Tukov-Yual, Alroy Bailiff, NP  ? ? ?Allergies as of 08/16/2021 - Review Complete 02/20/2019  ?Allergen Reaction Noted  ? Opium Itching 11/15/2015  ? Tramadol Nausea Only 10/03/2018  ? ? ?Family History  ?Problem Relation Age of Onset  ? Lung cancer Mother   ? Cancer Mother   ? Mental illness Maternal Aunt   ? Heart disease Father   ? Heart disease Maternal Grandmother   ? ? ?Social History  ? ?Socioeconomic History  ? Marital status: Single  ?  Spouse name: Not on file  ? Number of children: Not on file  ? Years of education: Not on file  ? Highest education level: Not on file  ?Occupational History  ?  Not on file  ?Tobacco Use  ? Smoking status: Never  ? Smokeless tobacco: Never  ?Vaping Use  ? Vaping Use: Never used  ?Substance and Sexual Activity  ? Alcohol use: Not Currently  ?  Alcohol/week: 70.0 standard drinks  ?  Types: 70 Cans of beer per week  ?  Comment: 10 beers a day / or more. everyday  ? Drug use: Yes  ?  Types: Marijuana  ? Sexual activity: Not Currently  ?Other Topics Concern  ? Not on file  ?Social History Narrative  ? Not on file  ? ?Social Determinants of Health  ? ?Financial Resource Strain: Not on file  ?Food Insecurity: Not on file  ?Transportation Needs: Not on file  ?Physical Activity: Not on file  ?Stress: Not on file  ?Social Connections: Not on file  ?Intimate Partner Violence: Not on file  ? ? ?Review of Systems: ?See HPI, otherwise negative ROS ? ?Physical Exam: ?BP (!) 154/98   Pulse 67   Temp (!) 96.5 ?F (35.8 ?C) (Temporal)   Resp 18   Ht 5\' 6"  (1.676 m)   Wt 66.7 kg   SpO2 100%   BMI 23.73 kg/m?  ?General:   Alert,  pleasant and cooperative in NAD ?Head:  Normocephalic and atraumatic. ?Neck:  Supple; no masses or thyromegaly. ?Lungs:  Clear throughout to auscultation.    ?Heart:  Regular rate and rhythm. ?Abdomen:  Soft, nontender and nondistended. Normal bowel sounds, without guarding, and without rebound.   ?Neurologic:  Alert and  oriented x4;  grossly normal neurologically. ? ?Impression/Plan: ?Chad Parrish is here for an colonoscopy to be performed for a history of adenomatous polyps on 2018 ? ? ?Risks, benefits, limitations, and alternatives regarding  colonoscopy have been reviewed with the patient.  Questions have been answered.  All parties agreeable. ? ? ?Midge Minium, MD  10/13/2021, 7:29 AM ?

## 2021-10-13 NOTE — Transfer of Care (Signed)
Immediate Anesthesia Transfer of Care Note ? ?Patient: Chad Parrish ? ?Procedure(s) Performed: COLONOSCOPY WITH PROPOFOL ? ?Patient Location: PACU ? ?Anesthesia Type:General ? ?Level of Consciousness: awake and drowsy ? ?Airway & Oxygen Therapy: Patient Spontanous Breathing ? ?Post-op Assessment: Report given to RN and Post -op Vital signs reviewed and stable ? ?Post vital signs: Reviewed and stable ? ?Last Vitals:  ?Vitals Value Taken Time  ?BP    ?Temp    ?Pulse 58 10/13/21 0758  ?Resp 16 10/13/21 0758  ?SpO2 99 % 10/13/21 0758  ? ? ?Last Pain:  ?Vitals:  ? 10/13/21 0651  ?TempSrc: Temporal  ?   ? ?  ? ?Complications: No notable events documented. ?

## 2021-10-13 NOTE — Anesthesia Procedure Notes (Signed)
Date/Time: 10/13/2021 7:32 AM ?Performed by: Ginger Carne, CRNA ?Pre-anesthesia Checklist: Patient identified, Emergency Drugs available, Suction available, Patient being monitored and Timeout performed ?Patient Re-evaluated:Patient Re-evaluated prior to induction ?Oxygen Delivery Method: Nasal cannula ?Preoxygenation: Pre-oxygenation with 100% oxygen ? ? ? ? ?

## 2021-10-13 NOTE — Op Note (Signed)
[facility] ?Gastroenterology ?Patient Name: Chad Parrish ?Procedure Date: 10/13/2021 7:32 AM ?MRN: [id number] ?Account #: [id number] ?Date of Birth: 1961-07-25 ?Admit Type: Outpatient ?Age: [age] ?[facility] ?Gender: Male ?Note Status: Finalized ?Instrument Name: Colonoscope [id number] ?Procedure:             Colonoscopy ?Indications:           High risk colon cancer surveillance: Personal history  ?                       of colonic polyps ?Providers:             Midge Miniumarren Joann Kulpa MD, MD ?Referring MD:          Sylvie FarrierNgwe A. Aycock MD (Referring MD) ?Medicines:             Propofol per Anesthesia ?Complications:         No immediate complications. ?Procedure:             Pre-Anesthesia Assessment: ?                       - Prior to the procedure, a History and Physical was  ?                       performed, and patient medications and allergies were  ?                       reviewed. The patient's tolerance of previous  ?                       anesthesia was also reviewed. The risks and benefits  ?                       of the procedure and the sedation options and risks  ?                       were discussed with the patient. All questions were  ?                       answered, and informed consent was obtained. Prior  ?                       Anticoagulants: The patient has taken no previous  ?                       anticoagulant or antiplatelet agents. ASA Grade  ?                       Assessment: II - A patient with mild systemic disease.  ?                       After reviewing the risks and benefits, the patient  ?                       was deemed in satisfactory condition to undergo the  ?                       procedure. ?  After obtaining informed consent, the colonoscope was  ?                       passed under direct vision. Throughout the procedure,  ?                       the patient's blood pressure, pulse, and oxygen  ?                       saturations were  monitored continuously. The  ?                       Colonoscope was introduced through the anus and  ?                       advanced to the the cecum, identified by appendiceal  ?                       orifice and ileocecal valve. The colonoscopy was  ?                       performed without difficulty. The patient tolerated  ?                       the procedure well. The quality of the bowel  ?                       preparation was excellent. ?Findings: ?     The perianal and digital rectal examinations were normal. ?     Multiple small-mouthed diverticula were found in the sigmoid colon. ?     Non-bleeding internal hemorrhoids were found during retroflexion. The  ?     hemorrhoids were Grade II (internal hemorrhoids that prolapse but reduce  ?     spontaneously). ?Impression:            - Diverticulosis in the sigmoid colon. ?                       - Non-bleeding internal hemorrhoids. ?                       - No specimens collected. ?Recommendation:        - Discharge patient to home. ?                       - Resume previous diet. ?                       - Continue present medications. ?                       - Repeat colonoscopy in 7 years for surveillance. ?Procedure Code(s):     --- Professional --- ?                       [phone], Colonoscopy, flexible; diagnostic, including  ?                       collection of specimen(s) by brushing or washing, when  ?  performed (separate procedure) ?Diagnosis Code(s):     --- Professional --- ?                       Z86.010, Personal history of colonic polyps ?CPT copyright 2019 [employer]. All rights reserved. ?The codes documented in this report are preliminary and upon coder review may  ?be revised to meet current compliance requirements. ?Midge Minium MD, MD ?10/13/2021 7:54:43 AM ?This report has been signed electronically. ?Number of Addenda: 0 ?Note Initiated On: 10/13/2021 7:32 AM ?Scope Withdrawal Time: 0 hours 8 minutes  55 seconds  ?Total Procedure Duration: 0 hours 13 minutes 31 seconds  ?Estimated Blood Loss:  Estimated blood loss: none. ?     [facility] ?

## 2021-10-13 NOTE — Anesthesia Postprocedure Evaluation (Signed)
Anesthesia Post Note ? ?Patient: Chad Parrish ? ?Procedure(s) Performed: COLONOSCOPY WITH PROPOFOL ? ?Patient location during evaluation: PACU ?Anesthesia Type: General ?Level of consciousness: awake and alert ?Pain management: pain level controlled ?Vital Signs Assessment: post-procedure vital signs reviewed and stable ?Respiratory status: spontaneous breathing, nonlabored ventilation, respiratory function stable and patient connected to nasal cannula oxygen ?Cardiovascular status: blood pressure returned to baseline and stable ?Postop Assessment: no apparent nausea or vomiting ?Anesthetic complications: no ? ? ?No notable events documented. ? ? ?Last Vitals:  ?Vitals:  ? 10/13/21 0757 10/13/21 0758  ?BP: 106/68 106/68  ?Pulse: (!) 58 (!) 58  ?Resp: 15 16  ?Temp: (!) 36.4 ?C   ?SpO2: 100% 99%  ?  ?Last Pain:  ?Vitals:  ? 10/13/21 0817  ?TempSrc:   ?PainSc: 0-No pain  ? ? ?  ?  ?  ?  ?  ?  ? ?Yevette Edwards ? ? ? ? ?

## 2021-10-13 NOTE — Anesthesia Preprocedure Evaluation (Signed)
Anesthesia Evaluation  ?Patient identified by MRN, date of birth, ID band ?Patient awake ? ? ? ?Reviewed: ?Allergy & Precautions, H&P , NPO status , Patient's Chart, lab work & pertinent test results, reviewed documented beta blocker date and time  ? ?Airway ?Mallampati: II ? ? ?Neck ROM: full ? ? ? Dental ? ?(+) Poor Dentition ?  ?Pulmonary ?neg pulmonary ROS,  ?  ?Pulmonary exam normal ? ? ? ? ? ? ? Cardiovascular ?negative cardio ROS ?Normal cardiovascular exam ?Rhythm:regular Rate:Normal ? ? ?  ?Neuro/Psych ?negative neurological ROS ? negative psych ROS  ? GI/Hepatic ?negative GI ROS, Neg liver ROS,   ?Endo/Other  ?negative endocrine ROS ? Renal/GU ?negative Renal ROS  ?negative genitourinary ?  ?Musculoskeletal ? ? Abdominal ?  ?Peds ? Hematology ?negative hematology ROS ?(+)   ?Anesthesia Other Findings ?Past Medical History: ?01/2017: Abnormal liver function ?No date: Allergy ?No date: Anxiety ?2017: Chronic hip pain ?No date: Seizures ([place]) ?No date: Substance abuse ([place]) ?Past Surgical History: ?No date: NO PAST SURGERIES ?BMI   ? Body Mass Index: 23.73 kg/m?  ?  ? Reproductive/Obstetrics ?negative OB ROS ? ?  ? ? ? ? ? ? ? ? ? ? ? ? ? ?  ?  ? ? ? ? ? ? ? ? ?Anesthesia Physical ?Anesthesia Plan ? ?ASA: 2 ? ?Anesthesia Plan: General  ? ?Post-op Pain Management:   ? ?Induction:  ? ?PONV Risk Score and Plan:  ? ?Airway Management Planned:  ? ?Additional Equipment:  ? ?Intra-op Plan:  ? ?Post-operative Plan:  ? ?Informed Consent: I have reviewed the patients History and Physical, chart, labs and discussed the procedure including the risks, benefits and alternatives for the proposed anesthesia with the patient or authorized representative who has indicated his/her understanding and acceptance.  ? ? ? ?Dental Advisory Given ? ?Plan Discussed with: CRNA ? ?Anesthesia Plan Comments:   ? ? ? ? ? ? ?Anesthesia Quick Evaluation ? ?

## 2021-10-14 ENCOUNTER — Encounter: Payer: Self-pay | Admitting: Gastroenterology

## 2023-07-11 ENCOUNTER — Encounter: Payer: Self-pay | Admitting: Emergency Medicine

## 2023-07-11 ENCOUNTER — Emergency Department: Payer: Medicaid Other

## 2023-07-11 ENCOUNTER — Other Ambulatory Visit: Payer: Self-pay

## 2023-07-11 DIAGNOSIS — Z5321 Procedure and treatment not carried out due to patient leaving prior to being seen by health care provider: Secondary | ICD-10-CM | POA: Insufficient documentation

## 2023-07-11 DIAGNOSIS — R109 Unspecified abdominal pain: Secondary | ICD-10-CM | POA: Diagnosis present

## 2023-07-11 LAB — COMPREHENSIVE METABOLIC PANEL
ALT: 32 U/L (ref 0–44)
AST: 39 U/L (ref 15–41)
Albumin: 5 g/dL (ref 3.5–5.0)
Alkaline Phosphatase: 58 U/L (ref 38–126)
Anion gap: 12 (ref 5–15)
BUN: 14 mg/dL (ref 6–20)
CO2: 24 mmol/L (ref 22–32)
Calcium: 9.4 mg/dL (ref 8.9–10.3)
Chloride: 101 mmol/L (ref 98–111)
Creatinine, Ser: 0.73 mg/dL (ref 0.61–1.24)
GFR, Estimated: >60 mL/min (ref >60)
Glucose, Bld: 162 mg/dL — ABNORMAL HIGH (ref 70–99)
Potassium: 3.3 mmol/L — ABNORMAL LOW (ref 3.5–5.1)
Sodium: 137 mmol/L (ref 135–145)
Total Bilirubin: 1 mg/dL (ref <1.2)
Total Protein: 8.4 g/dL — ABNORMAL HIGH (ref 6.5–8.1)

## 2023-07-11 LAB — CBC
HCT: 44.4 % (ref 39.0–52.0)
Hemoglobin: 15.1 g/dL (ref 13.0–17.0)
MCH: 32.8 pg (ref 26.0–34.0)
MCHC: 34 g/dL (ref 30.0–36.0)
MCV: 96.5 fL (ref 80.0–100.0)
Platelets: 238 10*3/uL (ref 150–400)
RBC: 4.6 MIL/uL (ref 4.22–5.81)
RDW: 13.8 % (ref 11.5–15.5)
WBC: 16.3 10*3/uL — ABNORMAL HIGH (ref 4.0–10.5)
nRBC: 0 % (ref 0.0–0.2)

## 2023-07-11 LAB — TROPONIN I (HIGH SENSITIVITY): Troponin I (High Sensitivity): 6 ng/L (ref <18)

## 2023-07-11 LAB — LIPASE, BLOOD: Lipase: 3324 U/L — ABNORMAL HIGH (ref 11–51)

## 2023-07-11 MED ORDER — FENTANYL CITRATE PF 50 MCG/ML IJ SOSY
50.0000 ug | PREFILLED_SYRINGE | Freq: Once | INTRAMUSCULAR | Status: AC
  Administered 2023-07-11: 50 ug via INTRAVENOUS
  Filled 2023-07-11: qty 1

## 2023-07-11 MED ORDER — SODIUM CHLORIDE 0.9 % IV BOLUS (SEPSIS): 1000.0000 mL | Freq: Once | INTRAVENOUS | Status: DC

## 2023-07-11 MED ORDER — IOHEXOL 300 MG/ML  SOLN
100.0000 mL | Freq: Once | INTRAMUSCULAR | Status: AC | PRN
  Administered 2023-07-12: 100 mL via INTRAVENOUS

## 2023-07-11 MED ORDER — ONDANSETRON HCL 4 MG/2ML IJ SOLN
4.0000 mg | Freq: Once | INTRAMUSCULAR | Status: AC
  Administered 2023-07-11: 4 mg via INTRAVENOUS
  Filled 2023-07-11: qty 2

## 2023-07-11 NOTE — ED Triage Notes (Addendum)
Patient ambulatory to triage with complaints of severe abdominal pain since approx 4pm today that started at work. Patient states he initially thought it was indigestion and would go away but states it has only worsened. Denies vomiting, fevers. Patient states it came on all of a sudden. Patient does endorse drinking daily, 6 pack per day for "years". Last drink was last night.

## 2023-07-12 ENCOUNTER — Telehealth: Payer: Self-pay | Admitting: Emergency Medicine

## 2023-07-12 ENCOUNTER — Emergency Department
Admission: EM | Admit: 2023-07-12 | Discharge: 2023-07-12 | Discharge status: Against Medical Advice | Payer: Medicaid Other | Attending: Emergency Medicine | Admitting: Emergency Medicine

## 2023-07-12 LAB — TROPONIN I (HIGH SENSITIVITY): Troponin I (High Sensitivity): 6 ng/L (ref <18)

## 2023-07-12 NOTE — Telephone Encounter (Signed)
Called patient due to left emergency department before provider exam to inquire about condition and follow up plans. Left message.
